# Patient Record
Sex: Female | Born: 1965 | Race: Black or African American | Hispanic: No | Marital: Single | Smoking: Former smoker
Health system: Southern US, Community
[De-identification: ages and names within clinical notes are randomized; demographics above are authoritative.]

## PROBLEM LIST (undated history)

## (undated) DIAGNOSIS — E119 Type 2 diabetes mellitus without complications: Secondary | ICD-10-CM

## (undated) DIAGNOSIS — L409 Psoriasis, unspecified: Secondary | ICD-10-CM

## (undated) DIAGNOSIS — R7303 Prediabetes: Secondary | ICD-10-CM

## (undated) DIAGNOSIS — E559 Vitamin D deficiency, unspecified: Secondary | ICD-10-CM

## (undated) DIAGNOSIS — I639 Cerebral infarction, unspecified: Secondary | ICD-10-CM

## (undated) DIAGNOSIS — L4050 Arthropathic psoriasis, unspecified: Secondary | ICD-10-CM

## (undated) DIAGNOSIS — K900 Celiac disease: Secondary | ICD-10-CM

## (undated) DIAGNOSIS — L732 Hidradenitis suppurativa: Secondary | ICD-10-CM

## (undated) DIAGNOSIS — E7800 Pure hypercholesterolemia, unspecified: Secondary | ICD-10-CM

## (undated) DIAGNOSIS — M069 Rheumatoid arthritis, unspecified: Secondary | ICD-10-CM

## (undated) HISTORY — PX: TONSILLECTOMY: SUR1361

## (undated) HISTORY — PX: SHOULDER SURGERY: SHX246

## (undated) HISTORY — PX: BACK SURGERY: SHX140

## (undated) HISTORY — PX: TUBAL LIGATION: SHX77

## (undated) HISTORY — PX: LIVER BIOPSY: SHX301

---

## 2014-05-15 ENCOUNTER — Emergency Department: Payer: Self-pay | Admitting: Emergency Medicine

## 2014-05-15 LAB — URINALYSIS, COMPLETE
BACTERIA: NONE SEEN
Bilirubin,UR: NEGATIVE
Glucose,UR: NEGATIVE mg/dL (ref 0–75)
Hyaline Cast: 2
KETONE: NEGATIVE
Leukocyte Esterase: NEGATIVE
Nitrite: NEGATIVE
Ph: 6 (ref 4.5–8.0)
Protein: NEGATIVE
RBC,UR: NONE SEEN /HPF (ref 0–5)
SPECIFIC GRAVITY: 1.004 (ref 1.003–1.030)
WBC UR: NONE SEEN /HPF (ref 0–5)

## 2014-05-15 LAB — TROPONIN I: Troponin-I: <0.02 ng/mL

## 2014-05-15 LAB — CBC
HCT: 39.1 % (ref 35.0–47.0)
HGB: 12.4 g/dL (ref 12.0–16.0)
MCH: 27 pg (ref 26.0–34.0)
MCHC: 31.6 g/dL — ABNORMAL LOW (ref 32.0–36.0)
MCV: 85 fL (ref 80–100)
Platelet: 229 10*3/uL (ref 150–440)
RBC: 4.58 10*6/uL (ref 3.80–5.20)
RDW: 13.8 % (ref 11.5–14.5)
WBC: 12.6 10*3/uL — ABNORMAL HIGH (ref 3.6–11.0)

## 2014-05-15 LAB — COMPREHENSIVE METABOLIC PANEL
ANION GAP: 5 — AB (ref 7–16)
AST: 24 U/L (ref 15–37)
Albumin: 3.2 g/dL — ABNORMAL LOW (ref 3.4–5.0)
Alkaline Phosphatase: 112 U/L
BILIRUBIN TOTAL: 0.3 mg/dL (ref 0.2–1.0)
BUN: 5 mg/dL — AB (ref 7–18)
CALCIUM: 8.5 mg/dL (ref 8.5–10.1)
Chloride: 109 mmol/L — ABNORMAL HIGH (ref 98–107)
Co2: 25 mmol/L (ref 21–32)
Creatinine: 0.9 mg/dL (ref 0.60–1.30)
EGFR (African American): >60
EGFR (Non-African Amer.): >60
GLUCOSE: 97 mg/dL (ref 65–99)
Osmolality: 275 (ref 275–301)
Potassium: 3.7 mmol/L (ref 3.5–5.1)
SGPT (ALT): 34 U/L
SODIUM: 139 mmol/L (ref 136–145)
TOTAL PROTEIN: 7.4 g/dL (ref 6.4–8.2)

## 2014-10-01 ENCOUNTER — Emergency Department: Payer: Self-pay | Admitting: Emergency Medicine

## 2015-01-20 ENCOUNTER — Emergency Department
Admit: 2015-01-20 | Disposition: A | Discharge status: Home / Self Care | Payer: Self-pay | Admitting: Emergency Medicine

## 2015-07-24 ENCOUNTER — Emergency Department
Admission: EM | Admit: 2015-07-24 | Discharge: 2015-07-24 | Disposition: A | Discharge status: Home / Self Care | Payer: Self-pay | Attending: Emergency Medicine | Admitting: Emergency Medicine

## 2015-07-24 ENCOUNTER — Emergency Department: Payer: Self-pay

## 2015-07-24 ENCOUNTER — Encounter: Payer: Self-pay | Admitting: *Deleted

## 2015-07-24 DIAGNOSIS — M25531 Pain in right wrist: Secondary | ICD-10-CM | POA: Insufficient documentation

## 2015-07-24 MED ORDER — KETOROLAC TROMETHAMINE 60 MG/2ML IM SOLN
60.0000 mg | Freq: Once | INTRAMUSCULAR | Status: AC
  Administered 2015-07-24: 60 mg via INTRAMUSCULAR
  Filled 2015-07-24: qty 2

## 2015-07-24 MED ORDER — DICLOFENAC SODIUM 75 MG PO TBEC: 75.0000 mg | DELAYED_RELEASE_TABLET | Freq: Two times a day (BID) | ORAL | Status: DC

## 2015-07-24 MED ORDER — HYDROCODONE-ACETAMINOPHEN 5-325 MG PO TABS: 1.0000 | ORAL_TABLET | ORAL | Status: DC | PRN

## 2015-07-24 NOTE — ED Provider Notes (Signed)
[facility] Emergency Department Chimamanda Siegfried Note  ____________________________________________  Time seen: Approximately 7:57 PM  I have reviewed the triage vital signs and the nursing notes.   HISTORY  Chief Complaint Wrist Pain    HPI Lexee Brashears is a 49 y.o. female who presents to the emergency room for right wrist pain for 4 days. Denies any known injury but states that she has a difficulty time using her arm. States pain is worse when she is trying to flip from pronate and supinate.   History reviewed. No pertinent past medical history.  There are no active problems to display for this patient.   Past Surgical History  Procedure Laterality Date  . Back surgery    . Tubal ligation    . Tonsillectomy    . Shoulder surgery      Current Outpatient Rx  Name  Route  Sig  Dispense  Refill  . diclofenac (VOLTAREN) 75 MG EC tablet   Oral   Take 1 tablet (75 mg total) by mouth 2 (two) times daily.   60 tablet   0   . HYDROcodone-acetaminophen (NORCO) 5-325 MG tablet   Oral   Take 1-2 tablets by mouth every 4 (four) hours as needed for moderate pain.   15 tablet   0     Allergies Review of patient's allergies indicates no known allergies.  No family history on file.  Social History Social History  Substance Use Topics  . Smoking status: Never Smoker   . Smokeless tobacco: None  . Alcohol Use: No    Review of Systems Constitutional: No fever/chills Eyes: No visual changes. ENT: No sore throat. Cardiovascular: Denies chest pain. Respiratory: Denies shortness of breath. Gastrointestinal: No abdominal pain.  No nausea, no vomiting.  No diarrhea.  No constipation. Genitourinary: Negative for dysuria. Musculoskeletal: Positive for right wrist pain Skin: Negative for rash. Neurological: Negative for headaches, focal weakness or numbness.  10-point ROS otherwise negative.  ____________________________________________   PHYSICAL  EXAM:  VITAL SIGNS: ED Triage Vitals  Enc Vitals Group     BP 07/24/15 1923 118/58 mmHg     Pulse Rate 07/24/15 1923 71     Resp 07/24/15 1923 18     Temp 07/24/15 1923 98 F (36.7 C)     Temp Source 07/24/15 1923 Oral     SpO2 07/24/15 1923 97 %     Weight 07/24/15 1923 216 lb (97.977 kg)     Height 07/24/15 1923 5\' 6"  (1.676 m)     Head Cir --      Peak Flow --      Pain Score 07/24/15 1920 9     Pain Loc --      Pain Edu? --      Excl. in [place]? --     Constitutional: Alert and oriented. Well appearing and in no acute distress. Cardiovascular: Normal rate, regular rhythm. Grossly normal heart sounds.  Good peripheral circulation. Respiratory: Normal respiratory effort.  No retractions. Lungs CTAB. Musculoskeletal: Right wrist with limited range of motion. Distal neurovascularly intact. Increased pain with pronation to supination. Able to flex but with extreme pain. No erythema or edema noted Neurologic:  Normal speech and language. No gross focal neurologic deficits are appreciated. No gait instability. Skin:  Skin is warm, dry and intact. No rash noted. Psychiatric: Mood and affect are normal. Speech and behavior are normal.  ____________________________________________   LABS (all labs ordered are listed, but only abnormal results are displayed)  Labs Reviewed - No data to display ____________________________________________    RADIOLOGY  Negative for fracture dislocation or edema. ____________________________________________   PROCEDURES  Procedure(s) performed: None  Critical Care performed: No  ____________________________________________   INITIAL IMPRESSION / ASSESSMENT AND PLAN / ED COURSE  Pertinent labs & imaging results that were available during my care of the patient were reviewed by me and considered in my medical decision making (see chart for details).  Acute right wrist pain, etiology unknown. Rx given for diclofenac 75 mg twice a day, and  Norco 5/325. Cockup wrist splint Velcro provided. Patient follow-up with PCP or return to the ER as needed. ____________________________________________   FINAL CLINICAL IMPRESSION(S) / ED DIAGNOSES  Final diagnoses:  Wrist pain, acute, right      Arlyss Repress, PA-C 07/24/15 2043  Schuyler Amor, MD 07/24/15 [phone]

## 2015-07-24 NOTE — ED Notes (Signed)
Pt reports right wrist pain since Sunday. No known injury.

## 2015-07-24 NOTE — ED Notes (Signed)
Patient transported to X-ray 

## 2015-07-24 NOTE — Discharge Instructions (Signed)
Cryotherapy °Cryotherapy means treatment with cold. Ice or gel packs can be used to reduce both pain and swelling. Ice is the most helpful within the first 24 to 48 hours after an injury or flare-up from overusing a muscle or joint. Sprains, strains, spasms, burning pain, shooting pain, and aches can all be eased with ice. Ice can also be used when recovering from surgery. Ice is effective, has very few side effects, and is safe for most people to use. °PRECAUTIONS  °Ice is not a safe treatment option for people with: °· Raynaud phenomenon. This is a condition affecting small blood vessels in the extremities. Exposure to cold may cause your problems to return. °· Cold hypersensitivity. There are many forms of cold hypersensitivity, including: °· Cold urticaria. Red, itchy hives appear on the skin when the tissues begin to warm after being iced. °· Cold erythema. This is a red, itchy rash caused by exposure to cold. °· Cold hemoglobinuria. Red blood cells break down when the tissues begin to warm after being iced. The hemoglobin that carry oxygen are passed into the urine because they cannot combine with blood proteins fast enough. °· Numbness or altered sensitivity in the area being iced. °If you have any of the following conditions, do not use ice until you have discussed cryotherapy with your caregiver: °· Heart conditions, such as arrhythmia, angina, or chronic heart disease. °· High blood pressure. °· Healing wounds or open skin in the area being iced. °· Current infections. °· Rheumatoid arthritis. °· Poor circulation. °· Diabetes. °Ice slows the blood flow in the region it is applied. This is beneficial when trying to stop inflamed tissues from spreading irritating chemicals to surrounding tissues. However, if you expose your skin to cold temperatures for too long or without the proper protection, you can damage your skin or nerves. Watch for signs of skin damage due to cold. °HOME CARE INSTRUCTIONS °Follow  these tips to use ice and cold packs safely. °· Place a dry or damp towel between the ice and skin. A damp towel will cool the skin more quickly, so you may need to shorten the time that the ice is used. °· For a more rapid response, add gentle compression to the ice. °· Ice for no more than 10 to 20 minutes at a time. The bonier the area you are icing, the less time it will take to get the benefits of ice. °· Check your skin after 5 minutes to make sure there are no signs of a poor response to cold or skin damage. °· Rest 20 minutes or more between uses. °· Once your skin is numb, you can end your treatment. You can test numbness by very lightly touching your skin. The touch should be so light that you do not see the skin dimple from the pressure of your fingertip. When using ice, most people will feel these normal sensations in this order: cold, burning, aching, and numbness. °· Do not use ice on someone who cannot communicate their responses to pain, such as small children or people with dementia. °HOW TO MAKE AN ICE PACK °Ice packs are the most common way to use ice therapy. Other methods include ice massage, ice baths, and cryosprays. Muscle creams that cause a cold, tingly feeling do not offer the same benefits that ice offers and should not be used as a substitute unless recommended by your caregiver. °To make an ice pack, do one of the following: °· Place crushed ice or a   bag of frozen vegetables in a sealable plastic bag. Squeeze out the excess air. Place this bag inside another plastic bag. Slide the bag into a pillowcase or place a damp towel between your skin and the bag.  Mix 3 parts water with 1 part rubbing alcohol. Freeze the mixture in a sealable plastic bag. When you remove the mixture from the freezer, it will be slushy. Squeeze out the excess air. Place this bag inside another plastic bag. Slide the bag into a pillowcase or place a damp towel between your skin and the bag. SEEK MEDICAL CARE  IF:  You develop white spots on your skin. This may give the skin a blotchy (mottled) appearance.  Your skin turns blue or pale.  Your skin becomes waxy or hard.  Your swelling gets worse. MAKE SURE YOU:   Understand these instructions.  Will watch your condition.  Will get help right away if you are not doing well or get worse.   This information is not intended to replace advice given to you by your health care provider. Make sure you discuss any questions you have with your health care provider.   Document Released: 05/18/2011 Document Revised: 10/12/2014 Document Reviewed: 05/18/2011 Elsevier Interactive Patient Education 2016 [place] therapy can help ease sore, stiff, injured, and tight muscles and joints. Heat relaxes your muscles, which may help ease your pain.  RISKS AND COMPLICATIONS If you have any of the following conditions, do not use heat therapy unless your health care provider has approved:  Poor circulation.  Healing wounds or scarred skin in the area being treated.  Diabetes, heart disease, or high blood pressure.  Not being able to feel (numbness) the area being treated.  Unusual swelling of the area being treated.  Active infections.  Blood clots.  Cancer.  Inability to communicate pain. This may include young children and people who have problems with their brain function (dementia).  Pregnancy. Heat therapy should only be used on old, pre-existing, or long-lasting (chronic) injuries. Do not use heat therapy on new injuries unless directed by your health care provider. HOW TO USE HEAT THERAPY There are several different kinds of heat therapy, including:  Moist heat pack.  Warm water bath.  Hot water bottle.  Electric heating pad.  Heated gel pack.  Heated wrap.  Electric heating pad. Use the heat therapy method suggested by your health care provider. Follow your health care provider's instructions on when  and how to use heat therapy. GENERAL HEAT THERAPY RECOMMENDATIONS  Do not sleep while using heat therapy. Only use heat therapy while you are awake.  Your skin may turn pink while using heat therapy. Do not use heat therapy if your skin turns red.  Do not use heat therapy if you have new pain.  High heat or long exposure to heat can cause burns. Be careful when using heat therapy to avoid burning your skin.  Do not use heat therapy on areas of your skin that are already irritated, such as with a rash or sunburn. SEEK MEDICAL CARE IF:  You have blisters, redness, swelling, or numbness.  You have new pain.  Your pain is worse. MAKE SURE YOU:  Understand these instructions.  Will watch your condition.  Will get help right away if you are not doing well or get worse.   This information is not intended to replace advice given to you by your health care provider. Make sure you discuss any questions you  have with your health care provider.   Document Released: 12/14/2011 Document Revised: 10/12/2014 Document Reviewed: 11/14/2013 Elsevier Interactive Patient Education 2016 [employer] Inc[employer]  Wrist Pain There are many things that can cause wrist pain. Some common causes include:  An injury to the wrist area, such as a sprain, strain, or fracture.  Overuse of the joint.  A condition that causes increased pressure on a nerve in the wrist (carpal tunnel syndrome).  Wear and tear of the joints that occurs with aging (osteoarthritis).  A variety of other types of arthritis. Sometimes, the cause of wrist pain is not known. The pain often goes away when you follow your health care provider's instructions for relieving pain at home. If your wrist pain continues, tests may need to be done to diagnose your condition. HOME CARE INSTRUCTIONS Pay attention to any changes in your symptoms. Take these actions to help with your pain:  Rest the wrist area for at least 48 hours or as told by your  health care provider.  If directed, apply ice to the injured area:  Put ice in a plastic bag.  Place a towel between your skin and the bag.  Leave the ice on for 20 minutes, 2-3 times per day.  Keep your arm raised (elevated) above the level of your heart while you are sitting or lying down.  If a splint or elastic bandage has been applied, use it as told by your health care provider.  Remove the splint or bandage only as told by your health care provider.  Loosen the splint or bandage if your fingers become numb or have a tingling feeling, or if they turn cold or blue.  Take over-the-counter and prescription medicines only as told by your health care provider.  Keep all follow-up visits as told by your health care provider. This is important. SEEK MEDICAL CARE IF:  Your pain is not helped by treatment.  Your pain gets worse. SEEK IMMEDIATE MEDICAL CARE IF:  Your fingers become swollen.  Your fingers turn white, very red, or cold and blue.  Your fingers are numb or have a tingling feeling.  You have difficulty moving your fingers.   This information is not intended to replace advice given to you by your health care provider. Make sure you discuss any questions you have with your health care provider.   Document Released: 07/01/2005 Document Revised: 06/12/2015 Document Reviewed: 02/06/2015 Elsevier Interactive Patient Education [employer].

## 2016-03-25 ENCOUNTER — Emergency Department
Admission: EM | Admit: 2016-03-25 | Discharge: 2016-03-25 | Disposition: A | Discharge status: Home / Self Care | Payer: Self-pay | Attending: Emergency Medicine | Admitting: Emergency Medicine

## 2016-03-25 ENCOUNTER — Encounter: Payer: Self-pay | Admitting: Emergency Medicine

## 2016-03-25 DIAGNOSIS — N61 Mastitis without abscess: Secondary | ICD-10-CM | POA: Insufficient documentation

## 2016-03-25 HISTORY — DX: Arthropathic psoriasis, unspecified: L40.50

## 2016-03-25 HISTORY — DX: Psoriasis, unspecified: L40.9

## 2016-03-25 HISTORY — DX: Vitamin D deficiency, unspecified: E55.9

## 2016-03-25 HISTORY — DX: Pure hypercholesterolemia, unspecified: E78.00

## 2016-03-25 HISTORY — DX: Celiac disease: K90.0

## 2016-03-25 HISTORY — DX: Prediabetes: R73.03

## 2016-03-25 LAB — CBC
HCT: 40.8 % (ref 35.0–47.0)
Hemoglobin: 13.2 g/dL (ref 12.0–16.0)
MCH: 26.7 pg (ref 26.0–34.0)
MCHC: 32.2 g/dL (ref 32.0–36.0)
MCV: 82.9 fL (ref 80.0–100.0)
PLATELETS: 227 10*3/uL (ref 150–440)
RBC: 4.92 MIL/uL (ref 3.80–5.20)
RDW: 13.4 % (ref 11.5–14.5)
WBC: 16.2 10*3/uL — ABNORMAL HIGH (ref 3.6–11.0)

## 2016-03-25 LAB — BASIC METABOLIC PANEL
Anion gap: 7 (ref 5–15)
BUN: 10 mg/dL (ref 6–20)
CALCIUM: 9.1 mg/dL (ref 8.9–10.3)
CO2: 25 mmol/L (ref 22–32)
CREATININE: 0.74 mg/dL (ref 0.44–1.00)
Chloride: 107 mmol/L (ref 101–111)
GFR calc non Af Amer: >60 mL/min (ref >60)
GLUCOSE: 87 mg/dL (ref 65–99)
Potassium: 3.7 mmol/L (ref 3.5–5.1)
Sodium: 139 mmol/L (ref 135–145)

## 2016-03-25 MED ORDER — OXYCODONE-ACETAMINOPHEN 5-325 MG PO TABS
1.0000 | ORAL_TABLET | ORAL | Status: DC | PRN
  Administered 2016-03-25: 1 via ORAL
  Filled 2016-03-25: qty 1

## 2016-03-25 MED ORDER — CEPHALEXIN 500 MG PO CAPS: 500.0000 mg | ORAL_CAPSULE | Freq: Three times a day (TID) | ORAL | Status: DC

## 2016-03-25 MED ORDER — SULFAMETHOXAZOLE-TRIMETHOPRIM 800-160 MG PO TABS: 1.0000 | ORAL_TABLET | Freq: Two times a day (BID) | ORAL | Status: DC

## 2016-03-25 MED ORDER — OXYCODONE-ACETAMINOPHEN 5-325 MG PO TABS: 1.0000 | ORAL_TABLET | Freq: Four times a day (QID) | ORAL | Status: DC | PRN

## 2016-03-25 NOTE — ED Notes (Signed)
Pt in via triage with complaints of left breast pain w/ red tinged discharge x 3 days.  Pt states pain is more severe today.  Pt reports last mammogram over 2 years ago, denies any family hx of breast cancer.  Pt A/Ox4, no immediate distress at this time.

## 2016-03-25 NOTE — ED Provider Notes (Signed)
[facility] Emergency Department Provider Note  ____________________________________________  Time seen: 3:40 PM  I have reviewed the triage vital signs and the nursing notes.   HISTORY  Chief Complaint Breast Pain and Breast Discharge    HPI Linda Bennett is a 49 y.o. female who complains of red tinged discharge from the left nipple for the past 3 days. No fevers or chills or vomiting. She otherwise feels well except for increasing pain in the left breast speech has a history of an abscess in the medial left breast that required surgical resection. No chest pain or shortness of breath. No recent trauma. He feels that the left breast has become swollen.     Past Medical History  Diagnosis Date  . Psoriasis   . Vitamin D deficiency   . Celiac disease   . Psoriatic arthritis ([place])   . High cholesterol   . Pre-diabetes      There are no active problems to display for this patient.    Past Surgical History  Procedure Laterality Date  . Back surgery    . Tubal ligation    . Tonsillectomy    . Shoulder surgery       Current Outpatient Rx  Name  Route  Sig  Dispense  Refill  . cephALEXin (KEFLEX) 500 MG capsule   Oral   Take 1 capsule (500 mg total) by mouth 3 (three) times daily.   21 capsule   0   . diclofenac (VOLTAREN) 75 MG EC tablet   Oral   Take 1 tablet (75 mg total) by mouth 2 (two) times daily.   60 tablet   0   . HYDROcodone-acetaminophen (NORCO) 5-325 MG tablet   Oral   Take 1-2 tablets by mouth every 4 (four) hours as needed for moderate pain.   15 tablet   0   . oxyCODONE-acetaminophen (ROXICET) 5-325 MG tablet   Oral   Take 1 tablet by mouth every 6 (six) hours as needed for severe pain.   12 tablet   0   . sulfamethoxazole-trimethoprim (BACTRIM DS) 800-160 MG tablet   Oral   Take 1 tablet by mouth 2 (two) times daily.   20 tablet   0      Allergies Review of patient's allergies indicates no known  allergies.   No family history on file.  Social History Social History  Substance Use Topics  . Smoking status: Never Smoker   . Smokeless tobacco: None  . Alcohol Use: No    Review of Systems  Constitutional:   No fever or chills.  Eyes:   No vision changes.  ENT:   No sore throat. No rhinorrhea. Cardiovascular:   No chest pain. Respiratory:   No dyspnea or cough. Gastrointestinal:   Negative for abdominal pain, vomiting and diarrhea.  Genitourinary:   Negative for dysuria or difficulty urinating. Musculoskeletal:   Left breast swelling and pain Neurological:   Negative for headaches 10-point ROS otherwise negative.  ____________________________________________   PHYSICAL EXAM:  VITAL SIGNS: ED Triage Vitals  Enc Vitals Group     BP 03/25/16 1422 135/81 mmHg     Pulse Rate 03/25/16 1422 62     Resp 03/25/16 1422 18     Temp 03/25/16 1422 98.5 F (36.9 C)     Temp Source 03/25/16 1422 Oral     SpO2 03/25/16 1422 100 %     Weight 03/25/16 1422 207 lb (93.895 kg)     Height 03/25/16  1422 5\' 6"  (1.676 m)     Head Cir --      Peak Flow --      Pain Score 03/25/16 1422 9     Pain Loc --      Pain Edu? --      Excl. in [place]? --     Vital signs reviewed, nursing assessments reviewed. Patient examined with nurse Delilah Shan at the bedside  Constitutional:   Alert and oriented. Well appearing and in no distress. Eyes:   No scleral icterus. No conjunctival pallor. EOMI ENT   Head:   Normocephalic and atraumatic.  Hematological/Lymphatic/Immunilogical:   No cervical lymphadenopathy. Cardiovascular:   RRR. Symmetric bilateral radial and DP pulses.  No murmurs.  Respiratory:   Normal respiratory effort without tachypnea nor retractions. Breath sounds are clear and equal bilaterally. No wheezes/rales/rhonchi.  Musculoskeletal:   Chest wall nontender. Left breast reveals an area in the inferomedial aspect of firmness and enlargement lingular tissue. Palpation of this  area expresses a brownish clear liquid from the nipple. This elicits pain and tenderness from the patient as well. No fluctuance. The rest of the breast is unremarkable.   Skin:   Inferomedial left breast scar from prior abscess surgery, medial to the affected area currently. Skin otherwise warm dry and intact unremarkable.  ____________________________________________    LABS (pertinent positives/negatives) (all labs ordered are listed, but only abnormal results are displayed) Labs Reviewed  CBC - Abnormal; Notable for the following:    WBC 16.2 (*)    All other components within normal limits  AEROBIC CULTURE (SUPERFICIAL SPECIMEN)  BASIC METABOLIC PANEL   ____________________________________________   EKG    ____________________________________________    RADIOLOGY    ____________________________________________   PROCEDURES   ____________________________________________   INITIAL IMPRESSION / ASSESSMENT AND PLAN / ED COURSE  Pertinent labs & imaging results that were available during my care of the patient were reviewed by me and considered in my medical decision making (see chart for details).  Patient presents with non-lactational mastitis of the left breast. 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 Patient is well-appearing and has a mild leukocytosis but otherwise unremarkable with normal vital signs. We'll start her on Bactrim and Keflex to hopefully clear the infection. Counseled her to take NSAIDs, short course of Percocet as needed, warm compresses, follow up with primary care and if unresolved surgery in a week. No evidence of any underlying cardiopulmonary pathology.     ____________________________________________   FINAL CLINICAL IMPRESSION(S) / ED DIAGNOSES  Final diagnoses:  Acute mastitis of left breast       Portions of this note were generated with dragon dictation software. Dictation errors may  occur despite best attempts at proofreading.   Carrie Mew, MD 03/25/16 [phone]

## 2016-03-25 NOTE — ED Notes (Signed)
Patient presents to the ED with left breast pain x 1 week, with reddish discharge x 3 days.  Patient states discharge was brown at first but now it's reddish.  Patient states she has had abnormal mammograms in the past that showed something unusual that was benign but it caused patient to have occasional clear discharge for the past 4 years.  Patient states left breast has also increased in size.

## 2016-03-25 NOTE — Discharge Instructions (Signed)
You were prescribed a medication that is potentially sedating. Do not drink alcohol, drive or participate in any other potentially dangerous activities while taking this medication as it may make you sleepy. Do not take this medication with any other sedating medications, either prescription or over-the-counter. If you were prescribed Percocet or Vicodin, do not take these with acetaminophen (Tylenol) as it is already contained within these medications.   Opioid pain medications (or "narcotics") can be habit forming.  Use it as little as possible to achieve adequate pain control.  Do not use or use it with extreme caution if you have a history of opiate abuse or dependence.  If you are on a pain contract with your primary care doctor or a pain specialist, be sure to let them know you were prescribed this medication today from the [facility] Emergency Department.  This medication is intended for your use only - do not give any to anyone else and keep it in a secure place where nobody else, especially children and pets, have access to it.  It will also cause or worsen constipation, so you may want to consider taking an over-the-counter stool softener while you are taking this medication.

## 2016-03-31 LAB — AEROBIC CULTURE W GRAM STAIN (SUPERFICIAL SPECIMEN): Culture: NORMAL

## 2016-03-31 LAB — AEROBIC CULTURE  (SUPERFICIAL SPECIMEN)

## 2016-08-21 ENCOUNTER — Emergency Department: Payer: Self-pay

## 2016-08-21 ENCOUNTER — Encounter: Payer: Self-pay | Admitting: Emergency Medicine

## 2016-08-21 ENCOUNTER — Emergency Department
Admission: EM | Admit: 2016-08-21 | Discharge: 2016-08-22 | Disposition: A | Discharge status: Home / Self Care | Payer: Self-pay | Attending: Student in an Organized Health Care Education/Training Program | Admitting: Student in an Organized Health Care Education/Training Program

## 2016-08-21 DIAGNOSIS — R51 Headache: Secondary | ICD-10-CM | POA: Insufficient documentation

## 2016-08-21 DIAGNOSIS — R519 Headache, unspecified: Secondary | ICD-10-CM

## 2016-08-21 DIAGNOSIS — Z79899 Other long term (current) drug therapy: Secondary | ICD-10-CM | POA: Insufficient documentation

## 2016-08-21 MED ORDER — ACETAMINOPHEN 500 MG PO TABS
1000.0000 mg | ORAL_TABLET | Freq: Once | ORAL | Status: AC
  Administered 2016-08-21: 1000 mg via ORAL
  Filled 2016-08-21: qty 2

## 2016-08-21 MED ORDER — DEXAMETHASONE SODIUM PHOSPHATE 4 MG/ML IJ SOLN
INTRAMUSCULAR | Status: AC
  Filled 2016-08-21: qty 3

## 2016-08-21 MED ORDER — TETRACAINE HCL 0.5 % OP SOLN
2.0000 [drp] | Freq: Once | OPHTHALMIC | Status: AC
  Administered 2016-08-21: 2 [drp] via OPHTHALMIC
  Filled 2016-08-21: qty 2

## 2016-08-21 MED ORDER — PROCHLORPERAZINE EDISYLATE 5 MG/ML IJ SOLN
10.0000 mg | Freq: Once | INTRAMUSCULAR | Status: AC
  Administered 2016-08-21: 10 mg via INTRAVENOUS
  Filled 2016-08-21: qty 2

## 2016-08-21 MED ORDER — DIPHENHYDRAMINE HCL 50 MG/ML IJ SOLN
25.0000 mg | Freq: Once | INTRAMUSCULAR | Status: AC
  Administered 2016-08-21: 25 mg via INTRAVENOUS
  Filled 2016-08-21: qty 1

## 2016-08-21 MED ORDER — DEXAMETHASONE SODIUM PHOSPHATE 10 MG/ML IJ SOLN
10.0000 mg | Freq: Once | INTRAMUSCULAR | Status: AC
  Administered 2016-08-21: 10 mg via INTRAVENOUS
  Filled 2016-08-21: qty 1

## 2016-08-21 NOTE — ED Triage Notes (Signed)
Pt. States HA started around 2 pm today.  Pt. States vomiting 3 times today, denies anyone in household with same symptoms.  Pt. Had two cysts removed this past Tuesday.

## 2016-08-21 NOTE — ED Provider Notes (Signed)
[facility] Emergency Department Provider Note    None    (approximate)  I have reviewed the triage vital signs and the nursing notes.   HISTORY  Chief Complaint Headache (Pt. states sudden HA today that started around 2 pm.  Pressure behind rt. eye and rt. side of head.) and Emesis (Pt. states "I vomited 3 times today after HA started.)    HPI Linda Bennett is a 50 y.o. female with a history of migraine headaches presents with acute headache started midafternoon on the right side of her head. Patient states that after onset of headache she had gradual worsening and it seemed to be located on the right side of her head behind her right eye radiating to the back of her head. Associated symptoms included nausea and vomiting. No recent fevers. No visual disturbance. No recent trauma. She's not on any blood thinners. No history of aneurysms or renal cysts. She currently rates the headache is not out of 10 in severity. There was not any thunderclap or sudden onset headache.   Past Medical History:  Diagnosis Date  . Celiac disease   . High cholesterol   . Pre-diabetes   . Psoriasis   . Psoriatic arthritis ([place])   . Vitamin D deficiency    No family history on file. Past Surgical History:  Procedure Laterality Date  . BACK SURGERY    . SHOULDER SURGERY    . TONSILLECTOMY    . TUBAL LIGATION     There are no active problems to display for this patient.     Prior to Admission medications   Medication Sig Start Date End Date Taking? Authorizing Provider  cephALEXin (KEFLEX) 500 MG capsule Take 1 capsule (500 mg total) by mouth 3 (three) times daily. 03/25/16   Carrie Mew, MD  diclofenac (VOLTAREN) 75 MG EC tablet Take 1 tablet (75 mg total) by mouth 2 (two) times daily. 07/24/15   Arlyss Repress, PA-C  HYDROcodone-acetaminophen (NORCO) 5-325 MG tablet Take 1-2 tablets by mouth every 4 (four) hours as needed for moderate pain. 07/24/15   Arlyss Repress, PA-C  oxyCODONE-acetaminophen (ROXICET) 5-325 MG tablet Take 1 tablet by mouth every 6 (six) hours as needed for severe pain. 03/25/16   Carrie Mew, MD  sulfamethoxazole-trimethoprim (BACTRIM DS) 800-160 MG tablet Take 1 tablet by mouth 2 (two) times daily. 03/25/16   Carrie Mew, MD    Allergies Patient has no known allergies.    Social History Social History  Substance Use Topics  . Smoking status: Never Smoker  . Smokeless tobacco: Not on file  . Alcohol use No    Review of Systems Patient denies headaches, rhinorrhea, blurry vision, numbness, shortness of breath, chest pain, edema, cough, abdominal pain, nausea, vomiting, diarrhea, dysuria, fevers, rashes or hallucinations unless otherwise stated above in HPI. ____________________________________________   PHYSICAL EXAM:  VITAL SIGNS: Vitals:   08/21/16 2205  BP: 124/69  Pulse: 64  Resp: 18  Temp: 98 F (36.7 C)    Constitutional: Alert and oriented. in no acute distress. Eyes: Conjunctivae are normal. PERRL. EOMI. Head: Atraumatic. Nose: No congestion/rhinnorhea. Mouth/Throat: Mucous membranes are moist.  Oropharynx non-erythematous. Neck: No stridor. Painless ROM. No cervical spine tenderness to palpation Hematological/Lymphatic/Immunilogical: No cervical lymphadenopathy. Cardiovascular: Normal rate, regular rhythm. Grossly normal heart sounds.  Good peripheral circulation. Respiratory: Normal respiratory effort.  No retractions. Lungs CTAB. Gastrointestinal: Soft and nontender. No distention. No abdominal bruits. No CVA tenderness. Musculoskeletal: No lower extremity tenderness  nor edema.  No joint effusions. Neurologic:  CN- intact.  No facial droop, Normal FNF.  Normal heel to shin.  Sensation intact bilaterally. Normal speech and language. No gross focal neurologic deficits are appreciated. No gait instability.  Skin:  Skin is warm, dry and intact. No rash noted. Psychiatric: Mood and affect  are normal. Speech and behavior are normal.  ____________________________________________   LABS (all labs ordered are listed, but only abnormal results are displayed)  No results found for this or any previous visit (from the past 24 hour(s)). ____________________________________________  ____________________________________________  [id number]  I personally reviewed all radiographic images ordered to evaluate for the above acute complaints and reviewed radiology reports and findings.  These findings were personally discussed with the patient.  Please see medical record for radiology report. ____________________________________________   PROCEDURES  Procedure(s) performed: none Procedures    Critical Care performed: no ____________________________________________   INITIAL IMPRESSION / ASSESSMENT AND PLAN / ED COURSE  Pertinent labs & imaging results that were available during my care of the patient were reviewed by me and considered in my medical decision making (see chart for details).  DDX: migraine, tension, cluster, glaucoma, sah, sdh, gca  Satcha Legleiter is a 50 y.o. who presents to the ED with with Hx of migraines p/w HA for last 6 hours. Not worst HA ever. Gradual onset. HA similar to previous episodes. Denies focal neurologic symptoms. Denies trauma. No fevers or neck pain. No vision loss. Afebrile in ED. VSS. Exam as above. No meningeal signs. No CN, motor, sensory or cerebellar deficits. Temporal arteries palpable and non-tender. Appears well and non-toxic.  Will provide  IV medications for symptom control. Likely tension, non-specific or possible migraine HA. Clinical picture is not consistent with ICH, SAH, SDH, EDH, TIA, or CVA. No concern for meningitis or encephalitis. No concern for GCA/Temporal arteritis.   Clinical Course as of Aug 21 2340  Ludwig Clarks Aug 21, 2016  2335 Ocular pressure is 17 and right globe. Visual acuity is normal. Patient with significant  improvement in symptoms. Do not feel that lumbar puncture quickly indicated at this time as she is otherwise well-appearing and in no acute distress without any evidence of meningeal irritation.  Have discussed with the patient and available family all diagnostics and treatments performed thus far and all questions were answered to the best of my ability. The patient demonstrates understanding and agreement with plan.   [PR]    Clinical Course User Index [PR] Merlyn Lot, MD     ____________________________________________   FINAL CLINICAL IMPRESSION(S) / ED DIAGNOSES  Final diagnoses:  Acute nonintractable headache, unspecified headache type      NEW MEDICATIONS STARTED DURING THIS VISIT:  New Prescriptions   No medications on file     Note:  This document was prepared using Dragon voice recognition software and may include unintentional dictation errors.    Merlyn Lot, MD 08/21/16 [phone]

## 2016-08-21 NOTE — ED Notes (Signed)
Patient transported to CT 

## 2016-08-21 NOTE — Discharge Instructions (Signed)

## 2017-10-05 HISTORY — PX: BREAST BIOPSY: SHX20

## 2018-08-04 ENCOUNTER — Encounter: Payer: Self-pay | Admitting: *Deleted

## 2018-08-04 ENCOUNTER — Emergency Department
Admission: EM | Admit: 2018-08-04 | Discharge: 2018-08-04 | Disposition: A | Discharge status: Home / Self Care | Payer: Medicaid Other | Attending: Emergency Medicine | Admitting: Emergency Medicine

## 2018-08-04 ENCOUNTER — Other Ambulatory Visit: Payer: Self-pay

## 2018-08-04 DIAGNOSIS — F17210 Nicotine dependence, cigarettes, uncomplicated: Secondary | ICD-10-CM | POA: Diagnosis not present

## 2018-08-04 DIAGNOSIS — L4050 Arthropathic psoriasis, unspecified: Secondary | ICD-10-CM | POA: Diagnosis not present

## 2018-08-04 DIAGNOSIS — M25532 Pain in left wrist: Secondary | ICD-10-CM | POA: Diagnosis present

## 2018-08-04 HISTORY — DX: Hidradenitis suppurativa: L73.2

## 2018-08-04 MED ORDER — APREMILAST 10 & 20 & 30 MG PO TBPK: 30.0000 mg | ORAL_TABLET | Freq: Two times a day (BID) | ORAL | 0 refills | Status: DC

## 2018-08-04 MED ORDER — MELOXICAM 15 MG PO TABS: 15.0000 mg | ORAL_TABLET | Freq: Every day | ORAL | 0 refills | Status: DC

## 2018-08-04 NOTE — ED Triage Notes (Signed)
Pt reporting left wrist and hand pain that started yesterday. No new injury but swelling noted and pt presents wearing a wrist brace. Sensation and color intact with equal radial pulses bilaterally.

## 2018-08-04 NOTE — ED Notes (Signed)
See triage note  Presents with pain to lateral aspect of left wrist  States she did notice some swelling to area no swelling noted at present  Good pulses

## 2018-08-04 NOTE — ED Provider Notes (Signed)
[facility] Emergency Department Provider Note  ____________________________________________  Time seen: Approximately 3:17 PM  I have reviewed the triage vital signs and the nursing notes.   HISTORY  Chief Complaint Wrist Pain    HPI Linda Bennett is a 52 y.o. female who presents the emergency department complaining of 1 day history of left wrist pain.  Patient she has a history of psoriasis and psoriatic arthritis.  She has been undergoing treatment for several years on same.  On review of patient's chart, patient has been tried on multiple medications to include methotrexate until she had transiently elevated LFTs, Enbrel, and currently  on Cosentyx.  Patient reports that she has not had an increase in her psoriasis but given the weather changes has experienced multiple joint pain.  Patient's current complaint is left wrist pain.  She denies any erythema, gross edema.  She is able to perform full range of motion but doing so increases her pain.  No other injury or complaint.  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  She has been receiving her Cosentyx injections on schedule.  Patient denies any headache, fevers or chills, chest pain, shortness of breath, nausea or vomiting.   Past Medical History:  Diagnosis Date  . Celiac disease   . Hidradenitis suppurativa   . High cholesterol   . Pre-diabetes   . Psoriasis   . Psoriatic arthritis ([place])   . Vitamin D deficiency     There are no active problems to display for this patient.   Past Surgical History:  Procedure Laterality Date  . BACK SURGERY    . SHOULDER SURGERY    . TONSILLECTOMY    . TUBAL LIGATION      Prior to Admission medications   Medication Sig Start Date End Date Taking? Authorizing Provider  Apremilast 10 & 20 & 30 MG TBPK Take 30 mg by mouth 2 (two) times daily. Day 1: 10 mg in the AM.  Day 2: 10 mg twice daily  Day 3: 10 mg in the AM, 20 mg in the PM     Day 4: 20 mg twice daily   Day 5: 20 mg in the AM, 30 mg in the PM  30 mg twice daily daily thereafter. 08/04/18   Ernan Runkles, Charline Bills, PA-C  cephALEXin (KEFLEX) 500 MG capsule Take 1 capsule (500 mg total) by mouth 3 (three) times daily. 03/25/16   Carrie Mew, MD  diclofenac (VOLTAREN) 75 MG EC tablet Take 1 tablet (75 mg total) by mouth 2 (two) times daily. 07/24/15   Beers, Pierce Crane, PA-C  HYDROcodone-acetaminophen (NORCO) 5-325 MG tablet Take 1-2 tablets by mouth every 4 (four) hours as needed for moderate pain. 07/24/15   Beers, Pierce Crane, PA-C  meloxicam (MOBIC) 15 MG tablet Take 1 tablet (15 mg total) by mouth daily. 08/04/18   Camdyn Beske, Charline Bills, PA-C  oxyCODONE-acetaminophen (ROXICET) 5-325 MG tablet Take 1 tablet by mouth every 6 (six) hours as needed for severe pain. 03/25/16   Carrie Mew, MD  sulfamethoxazole-trimethoprim (BACTRIM DS) 800-160 MG tablet Take 1 tablet by mouth 2 (two) times daily. 03/25/16   Carrie Mew, MD    Allergies Patient has no known allergies.  No family history on file.  Social History Social History   Tobacco Use  . Smoking status: Current Every Day Smoker    Packs/day: 0.50    Types: Cigarettes  . Smokeless tobacco: Never Used  Substance Use Topics  .  Alcohol use: No  . Drug use: No     Review of Systems  Constitutional: No fever/chills Eyes: No visual changes.  Cardiovascular: no chest pain. Respiratory: no cough. No SOB. Gastrointestinal: No abdominal pain.  No nausea, no vomiting.  No diarrhea.  No constipation. Genitourinary: Negative for dysuria. No hematuria Musculoskeletal: Left wrist pain Skin: Positive for psoriatic rash, no increased from baseline.  No abrasions, lacerations, ecchymosis. Neurological: Negative for headaches, focal weakness or numbness. 10-point ROS otherwise negative.  ____________________________________________   PHYSICAL EXAM:  VITAL SIGNS: ED Triage Vitals [08/04/18 1510]   Enc Vitals Group     BP (!) 123/98     Pulse Rate 68     Resp 16     Temp 98.2 F (36.8 C)     Temp Source Oral     SpO2 98 %     Weight      Height      Head Circumference      Peak Flow      Pain Score 9     Pain Loc      Pain Edu?      Excl. in [place]?      Constitutional: Alert and oriented. Well appearing and in no acute distress. Eyes: Conjunctivae are normal. PERRL. EOMI. Head: Atraumatic. Neck: No stridor.    Cardiovascular: Normal rate, regular rhythm. Normal S1 and S2.  Good peripheral circulation. Respiratory: Normal respiratory effort without tachypnea or retractions. Lungs CTAB. Good air entry to the bases with no decreased or absent breath sounds. Musculoskeletal: Full range of motion to all extremities. No gross deformities appreciated.  Visualization of the left wrist reveals no deformity, edema, erythema.  Patient is able to perform full range of motion with encouragement.  Patient is tender to palpation over the ulnar styloid process.  No palpable abnormality or deficit.  Joint is minimally warm to palpation.  No ballottement.  Radial pulse intact.  Sensation intact all 5 digits distally. Neurologic:  Normal speech and language. No gross focal neurologic deficits are appreciated.  Skin:  Skin is warm, dry and intact. No rash noted. Psychiatric: Mood and affect are normal. Speech and behavior are normal. Patient exhibits appropriate insight and judgement.   ____________________________________________   LABS (all labs ordered are listed, but only abnormal results are displayed)  Labs Reviewed - No data to display ____________________________________________  EKG   ____________________________________________  RADIOLOGY   No results found.  ____________________________________________    PROCEDURES  Procedure(s) performed:    Procedures    Medications - No data to display   ____________________________________________   INITIAL  IMPRESSION / ASSESSMENT AND PLAN / ED COURSE  Pertinent labs & imaging results that were available during my care of the patient were reviewed by me and considered in my medical decision making (see chart for details).  Review of the [place] CSRS was performed in accordance of the [place] prior to dispensing any controlled drugs.      Patient's diagnosis is consistent with psoriatic arthritis.  Patient presents the emergency department with nontraumatic left wrist pain.  Patient has a history of psoriatic arthritis, states that symptoms are similar.  Patient reports that the weather change typically aggravates her psoriatic arthritis.  No trauma.  Differential includes psoriatic arthritis, rheumatoid arthritis, osteoarthritis, wrist sprain, fracture, gout, septic arthritis.  On exam, no gross indication of septic arthritis.  No history of gout.  Patient's symptoms are consistent with flare of her psoriatic arthritis.  Patient will be placed  on meloxicam for symptom improvement.  I discussed [place] with the patient.  I recommend discussing this medication with rheumatology before starting prescription for same.  Patient is prescribed meloxicam and [place] Nail, however she is to hold [place] prescription until she discusses with rheumatology..  Follow-up with primary care or rheumatology as needed.  Patient is given ED precautions to return to the ED for any worsening or new symptoms.     ____________________________________________  FINAL CLINICAL IMPRESSION(S) / ED DIAGNOSES  Final diagnoses:  Psoriatic arthritis ([place])  Left wrist pain      NEW MEDICATIONS STARTED DURING THIS VISIT:  ED Discharge Orders         Ordered    meloxicam (MOBIC) 15 MG tablet  Daily     08/04/18 1557    Apremilast 10 & 20 & 30 MG TBPK  2 times daily     08/04/18 1604              This chart was dictated using voice recognition software/Dragon. Despite best efforts to proofread, errors can occur which can change  the meaning. Any change was purely unintentional.    Darletta Moll, PA-C 08/04/18 1611    Nance Pear, MD 08/04/18 [phone]

## 2018-10-10 ENCOUNTER — Emergency Department
Admission: EM | Admit: 2018-10-10 | Discharge: 2018-10-10 | Discharge status: Against Medical Advice | Payer: Medicaid Other

## 2018-10-10 NOTE — ED Triage Notes (Signed)
Brought in by EMS for abd pain since yesterday EMs vitals b/p149/69, cbg 173

## 2019-02-25 ENCOUNTER — Encounter
Admission: EM | Disposition: A | Discharge status: Home / Self Care | Payer: Self-pay | Source: Home / Self Care | Attending: Emergency Medicine

## 2019-02-25 ENCOUNTER — Encounter: Payer: Self-pay | Admitting: *Deleted

## 2019-02-25 ENCOUNTER — Other Ambulatory Visit: Payer: Self-pay

## 2019-02-25 ENCOUNTER — Emergency Department: Payer: Medicaid Other

## 2019-02-25 ENCOUNTER — Observation Stay: Payer: Medicaid Other | Admitting: Registered Nurse

## 2019-02-25 ENCOUNTER — Observation Stay
Admission: EM | Admit: 2019-02-25 | Discharge: 2019-02-26 | Disposition: A | Discharge status: Home / Self Care | Payer: Medicaid Other | Attending: Surgery | Admitting: Surgery

## 2019-02-25 DIAGNOSIS — Z1159 Encounter for screening for other viral diseases: Secondary | ICD-10-CM | POA: Insufficient documentation

## 2019-02-25 DIAGNOSIS — K810 Acute cholecystitis: Secondary | ICD-10-CM | POA: Diagnosis not present

## 2019-02-25 DIAGNOSIS — Z791 Long term (current) use of non-steroidal anti-inflammatories (NSAID): Secondary | ICD-10-CM | POA: Diagnosis not present

## 2019-02-25 DIAGNOSIS — K760 Fatty (change of) liver, not elsewhere classified: Secondary | ICD-10-CM | POA: Insufficient documentation

## 2019-02-25 DIAGNOSIS — L4050 Arthropathic psoriasis, unspecified: Secondary | ICD-10-CM | POA: Insufficient documentation

## 2019-02-25 DIAGNOSIS — K8010 Calculus of gallbladder with chronic cholecystitis without obstruction: Principal | ICD-10-CM | POA: Insufficient documentation

## 2019-02-25 DIAGNOSIS — R1013 Epigastric pain: Secondary | ICD-10-CM | POA: Diagnosis present

## 2019-02-25 DIAGNOSIS — Z79899 Other long term (current) drug therapy: Secondary | ICD-10-CM | POA: Insufficient documentation

## 2019-02-25 DIAGNOSIS — K8020 Calculus of gallbladder without cholecystitis without obstruction: Secondary | ICD-10-CM

## 2019-02-25 DIAGNOSIS — K819 Cholecystitis, unspecified: Secondary | ICD-10-CM | POA: Diagnosis present

## 2019-02-25 DIAGNOSIS — F17210 Nicotine dependence, cigarettes, uncomplicated: Secondary | ICD-10-CM | POA: Diagnosis not present

## 2019-02-25 HISTORY — PX: CHOLECYSTECTOMY: SHX55

## 2019-02-25 LAB — COMPREHENSIVE METABOLIC PANEL
ALT: 187 U/L — ABNORMAL HIGH (ref 0–44)
AST: 396 U/L — ABNORMAL HIGH (ref 15–41)
Albumin: 3.9 g/dL (ref 3.5–5.0)
Alkaline Phosphatase: 188 U/L — ABNORMAL HIGH (ref 38–126)
Anion gap: 9 (ref 5–15)
BUN: 17 mg/dL (ref 6–20)
CO2: 25 mmol/L (ref 22–32)
Calcium: 9.2 mg/dL (ref 8.9–10.3)
Chloride: 105 mmol/L (ref 98–111)
Creatinine, Ser: 0.91 mg/dL (ref 0.44–1.00)
GFR calc Af Amer: >60 mL/min (ref >60)
GFR calc non Af Amer: >60 mL/min (ref >60)
Glucose, Bld: 165 mg/dL — ABNORMAL HIGH (ref 70–99)
Potassium: 4 mmol/L (ref 3.5–5.1)
Sodium: 139 mmol/L (ref 135–145)
Total Bilirubin: 1.4 mg/dL — ABNORMAL HIGH (ref 0.3–1.2)
Total Protein: 7.8 g/dL (ref 6.5–8.1)

## 2019-02-25 LAB — CBC
HCT: 38.5 % (ref 36.0–46.0)
Hemoglobin: 12.2 g/dL (ref 12.0–15.0)
MCH: 26.2 pg (ref 26.0–34.0)
MCHC: 31.7 g/dL (ref 30.0–36.0)
MCV: 82.6 fL (ref 80.0–100.0)
Platelets: 308 10*3/uL (ref 150–400)
RBC: 4.66 MIL/uL (ref 3.87–5.11)
RDW: 13.2 % (ref 11.5–15.5)
WBC: 14 10*3/uL — ABNORMAL HIGH (ref 4.0–10.5)
nRBC: 0 % (ref 0.0–0.2)

## 2019-02-25 LAB — SARS CORONAVIRUS 2 BY RT PCR (HOSPITAL ORDER, PERFORMED IN ~~LOC~~ HOSPITAL LAB): SARS Coronavirus 2: NEGATIVE

## 2019-02-25 LAB — SURGICAL PCR SCREEN
MRSA, PCR: NEGATIVE
Staphylococcus aureus: NEGATIVE

## 2019-02-25 LAB — LIPASE, BLOOD: Lipase: 49 U/L (ref 11–51)

## 2019-02-25 LAB — TROPONIN I: Troponin I: <0.03 ng/mL (ref <0.03)

## 2019-02-25 SURGERY — LAPAROSCOPIC CHOLECYSTECTOMY
Anesthesia: General

## 2019-02-25 MED ORDER — CHLORHEXIDINE GLUCONATE CLOTH 2 % EX PADS
6.0000 | MEDICATED_PAD | Freq: Once | CUTANEOUS | Status: AC
  Administered 2019-02-25: 13:00:00 6 via TOPICAL
  Filled 2019-02-25: qty 6

## 2019-02-25 MED ORDER — MIDAZOLAM HCL 2 MG/2ML IJ SOLN
INTRAMUSCULAR | Status: DC | PRN
  Administered 2019-02-25: 2 mg via INTRAVENOUS

## 2019-02-25 MED ORDER — ONDANSETRON HCL 4 MG/2ML IJ SOLN
INTRAMUSCULAR | Status: AC
  Filled 2019-02-25: qty 2

## 2019-02-25 MED ORDER — SODIUM CHLORIDE 0.9 % IV SOLN
INTRAVENOUS | Status: DC
  Administered 2019-02-25 – 2019-02-26 (×3): via INTRAVENOUS

## 2019-02-25 MED ORDER — ONDANSETRON 4 MG PO TBDP: 4.0000 mg | ORAL_TABLET | Freq: Four times a day (QID) | ORAL | Status: DC | PRN

## 2019-02-25 MED ORDER — GADOBUTROL 1 MMOL/ML IV SOLN
10.0000 mL | Freq: Once | INTRAVENOUS | Status: AC | PRN
  Administered 2019-02-25: 10 mL via INTRAVENOUS

## 2019-02-25 MED ORDER — LIDOCAINE HCL (PF) 2 % IJ SOLN
INTRAMUSCULAR | Status: AC
  Filled 2019-02-25: qty 10

## 2019-02-25 MED ORDER — PROCHLORPERAZINE EDISYLATE 10 MG/2ML IJ SOLN
5.0000 mg | Freq: Four times a day (QID) | INTRAMUSCULAR | Status: DC | PRN
  Administered 2019-02-25: 10 mg via INTRAVENOUS
  Filled 2019-02-25 (×2): qty 2

## 2019-02-25 MED ORDER — FENTANYL CITRATE (PF) 100 MCG/2ML IJ SOLN
25.0000 ug | INTRAMUSCULAR | Status: DC | PRN
  Administered 2019-02-25 (×4): 25 ug via INTRAVENOUS

## 2019-02-25 MED ORDER — CODEINE SULFATE 30 MG PO TABS: 60.0000 mg | ORAL_TABLET | ORAL | Status: DC | PRN

## 2019-02-25 MED ORDER — MUPIROCIN 2 % EX OINT
1.0000 "application " | TOPICAL_OINTMENT | Freq: Two times a day (BID) | CUTANEOUS | Status: DC
  Filled 2019-02-25: qty 22

## 2019-02-25 MED ORDER — BUPIVACAINE-EPINEPHRINE (PF) 0.25% -1:200000 IJ SOLN
INTRAMUSCULAR | Status: AC
  Filled 2019-02-25: qty 30

## 2019-02-25 MED ORDER — SUGAMMADEX SODIUM 500 MG/5ML IV SOLN
INTRAVENOUS | Status: DC | PRN
  Administered 2019-02-25: 450 mg via INTRAVENOUS

## 2019-02-25 MED ORDER — PANTOPRAZOLE SODIUM 40 MG IV SOLR
40.0000 mg | Freq: Every day | INTRAVENOUS | Status: DC
  Administered 2019-02-25: 23:00:00 40 mg via INTRAVENOUS
  Filled 2019-02-25: qty 40

## 2019-02-25 MED ORDER — LIDOCAINE HCL (CARDIAC) PF 100 MG/5ML IV SOSY
PREFILLED_SYRINGE | INTRAVENOUS | Status: DC | PRN
  Administered 2019-02-25: 100 mg via INTRAVENOUS

## 2019-02-25 MED ORDER — ACETAMINOPHEN 500 MG PO TABS
1000.0000 mg | ORAL_TABLET | Freq: Four times a day (QID) | ORAL | Status: DC
  Administered 2019-02-26: 1000 mg via ORAL
  Filled 2019-02-25 (×2): qty 2

## 2019-02-25 MED ORDER — FENTANYL CITRATE (PF) 100 MCG/2ML IJ SOLN
INTRAMUSCULAR | Status: DC | PRN
  Administered 2019-02-25 (×2): 25 ug via INTRAVENOUS
  Administered 2019-02-25: 50 ug via INTRAVENOUS

## 2019-02-25 MED ORDER — FENTANYL CITRATE (PF) 100 MCG/2ML IJ SOLN
INTRAMUSCULAR | Status: AC
  Filled 2019-02-25: qty 2

## 2019-02-25 MED ORDER — ONDANSETRON HCL 4 MG/2ML IJ SOLN: 4.0000 mg | Freq: Once | INTRAMUSCULAR | Status: DC | PRN

## 2019-02-25 MED ORDER — PIPERACILLIN-TAZOBACTAM 3.375 G IVPB
3.3750 g | Freq: Three times a day (TID) | INTRAVENOUS | Status: DC
  Administered 2019-02-25: 09:00:00 3.375 g via INTRAVENOUS

## 2019-02-25 MED ORDER — ACETAMINOPHEN 10 MG/ML IV SOLN
INTRAVENOUS | Status: DC | PRN
  Administered 2019-02-25: 1000 mg via INTRAVENOUS

## 2019-02-25 MED ORDER — CHLORHEXIDINE GLUCONATE CLOTH 2 % EX PADS: 6.0000 | MEDICATED_PAD | Freq: Once | CUTANEOUS | Status: DC

## 2019-02-25 MED ORDER — HYDROMORPHONE HCL 1 MG/ML IJ SOLN
1.0000 mg | INTRAMUSCULAR | Status: AC
  Administered 2019-02-25: 02:00:00 1 mg via INTRAVENOUS
  Filled 2019-02-25: qty 1

## 2019-02-25 MED ORDER — DEXMEDETOMIDINE HCL IN NACL 200 MCG/50ML IV SOLN
INTRAVENOUS | Status: DC | PRN
  Administered 2019-02-25: 20 ug via INTRAVENOUS

## 2019-02-25 MED ORDER — KETOROLAC TROMETHAMINE 30 MG/ML IJ SOLN: 30.0000 mg | Freq: Four times a day (QID) | INTRAMUSCULAR | Status: DC | PRN

## 2019-02-25 MED ORDER — PIPERACILLIN-TAZOBACTAM 3.375 G IVPB
3.3750 g | Freq: Three times a day (TID) | INTRAVENOUS | Status: DC
  Administered 2019-02-25 – 2019-02-26 (×3): 3.375 g via INTRAVENOUS
  Filled 2019-02-25 (×4): qty 50

## 2019-02-25 MED ORDER — HYDROMORPHONE HCL 1 MG/ML IJ SOLN
INTRAMUSCULAR | Status: AC
  Administered 2019-02-25: 1 mg via INTRAVENOUS
  Filled 2019-02-25: qty 1

## 2019-02-25 MED ORDER — ONDANSETRON HCL 4 MG/2ML IJ SOLN
INTRAMUSCULAR | Status: DC | PRN
  Administered 2019-02-25: 4 mg via INTRAVENOUS

## 2019-02-25 MED ORDER — GABAPENTIN 400 MG PO CAPS
400.0000 mg | ORAL_CAPSULE | Freq: Three times a day (TID) | ORAL | Status: DC
  Filled 2019-02-25: qty 1

## 2019-02-25 MED ORDER — MIDAZOLAM HCL 2 MG/2ML IJ SOLN
INTRAMUSCULAR | Status: AC
  Filled 2019-02-25: qty 2

## 2019-02-25 MED ORDER — KETOROLAC TROMETHAMINE 30 MG/ML IJ SOLN
30.0000 mg | Freq: Four times a day (QID) | INTRAMUSCULAR | Status: DC
  Administered 2019-02-25 – 2019-02-26 (×2): 30 mg via INTRAVENOUS
  Filled 2019-02-25 (×2): qty 1

## 2019-02-25 MED ORDER — HYDRALAZINE HCL 20 MG/ML IJ SOLN: 10.0000 mg | INTRAMUSCULAR | Status: DC | PRN

## 2019-02-25 MED ORDER — HYDROMORPHONE HCL 1 MG/ML IJ SOLN
1.0000 mg | INTRAMUSCULAR | Status: DC | PRN
  Administered 2019-02-25 (×3): 1 mg via INTRAVENOUS
  Filled 2019-02-25 (×2): qty 1

## 2019-02-25 MED ORDER — HEPARIN SODIUM (PORCINE) 5000 UNIT/ML IJ SOLN
5000.0000 [IU] | Freq: Three times a day (TID) | INTRAMUSCULAR | Status: DC
  Administered 2019-02-25 – 2019-02-26 (×2): 5000 [IU] via SUBCUTANEOUS
  Filled 2019-02-25 (×2): qty 1

## 2019-02-25 MED ORDER — PROPOFOL 10 MG/ML IV BOLUS
INTRAVENOUS | Status: AC
  Filled 2019-02-25: qty 20

## 2019-02-25 MED ORDER — ONDANSETRON HCL 4 MG/2ML IJ SOLN
4.0000 mg | Freq: Four times a day (QID) | INTRAMUSCULAR | Status: DC | PRN
  Administered 2019-02-25 (×2): 4 mg via INTRAVENOUS
  Filled 2019-02-25: qty 2

## 2019-02-25 MED ORDER — ROCURONIUM BROMIDE 100 MG/10ML IV SOLN
INTRAVENOUS | Status: DC | PRN
  Administered 2019-02-25: 50 mg via INTRAVENOUS

## 2019-02-25 MED ORDER — DEXAMETHASONE SODIUM PHOSPHATE 10 MG/ML IJ SOLN
INTRAMUSCULAR | Status: DC | PRN
  Administered 2019-02-25: 10 mg via INTRAVENOUS

## 2019-02-25 MED ORDER — ROCURONIUM BROMIDE 50 MG/5ML IV SOLN
INTRAVENOUS | Status: AC
  Filled 2019-02-25: qty 1

## 2019-02-25 MED ORDER — LACTATED RINGERS IV SOLN
INTRAVENOUS | Status: DC | PRN
  Administered 2019-02-25: 16:00:00 via INTRAVENOUS

## 2019-02-25 MED ORDER — PROPOFOL 10 MG/ML IV BOLUS
INTRAVENOUS | Status: DC | PRN
  Administered 2019-02-25: 170 mg via INTRAVENOUS

## 2019-02-25 MED ORDER — SODIUM CHLORIDE 0.9 % IV BOLUS
1000.0000 mL | Freq: Once | INTRAVENOUS | Status: AC
  Administered 2019-02-25: 09:00:00 1000 mL via INTRAVENOUS

## 2019-02-25 MED ORDER — ONDANSETRON HCL 4 MG/2ML IJ SOLN
4.0000 mg | INTRAMUSCULAR | Status: AC
  Administered 2019-02-25: 4 mg via INTRAVENOUS
  Filled 2019-02-25: qty 2

## 2019-02-25 MED ORDER — BUPIVACAINE-EPINEPHRINE 0.25% -1:200000 IJ SOLN
INTRAMUSCULAR | Status: DC | PRN
  Administered 2019-02-25: 30 mL

## 2019-02-25 MED ORDER — DEXAMETHASONE SODIUM PHOSPHATE 10 MG/ML IJ SOLN
INTRAMUSCULAR | Status: AC
  Filled 2019-02-25: qty 1

## 2019-02-25 MED ORDER — PROCHLORPERAZINE MALEATE 10 MG PO TABS
10.0000 mg | ORAL_TABLET | Freq: Four times a day (QID) | ORAL | Status: DC | PRN
  Filled 2019-02-25: qty 1

## 2019-02-25 SURGICAL SUPPLY — 41 items
APPLICATOR COTTON TIP 6 STRL (MISCELLANEOUS) IMPLANT
APPLICATOR COTTON TIP 6IN STRL (MISCELLANEOUS)
APPLIER CLIP 5 13 M/L LIGAMAX5 (MISCELLANEOUS) ×2
CANISTER SUCT 1200ML W/VALVE (MISCELLANEOUS) ×2 IMPLANT
CHLORAPREP W/TINT 26 (MISCELLANEOUS) ×2 IMPLANT
CHOLANGIOGRAM CATH TAUT (CATHETERS) IMPLANT
CLIP APPLIE 5 13 M/L LIGAMAX5 (MISCELLANEOUS) ×1 IMPLANT
COVER WAND RF STERILE (DRAPES) IMPLANT
DECANTER SPIKE VIAL GLASS SM (MISCELLANEOUS) IMPLANT
DERMABOND ADVANCED (GAUZE/BANDAGES/DRESSINGS) ×1
DERMABOND ADVANCED .7 DNX12 (GAUZE/BANDAGES/DRESSINGS) ×1 IMPLANT
DRAPE C-ARM XRAY 36X54 (DRAPES) ×4 IMPLANT
ELECT CAUTERY BLADE 6.4 (BLADE) ×2 IMPLANT
ELECT REM PT RETURN 9FT ADLT (ELECTROSURGICAL) ×2
ELECTRODE REM PT RTRN 9FT ADLT (ELECTROSURGICAL) ×1 IMPLANT
GLOVE BIO SURGEON STRL SZ7 (GLOVE) ×6 IMPLANT
GOWN STRL REUS W/ TWL LRG LVL3 (GOWN DISPOSABLE) ×3 IMPLANT
GOWN STRL REUS W/TWL LRG LVL3 (GOWN DISPOSABLE) ×3
IRRIGATION STRYKERFLOW (MISCELLANEOUS) ×1 IMPLANT
IRRIGATOR STRYKERFLOW (MISCELLANEOUS) ×2
IV CATH ANGIO 12GX3 LT BLUE (NEEDLE) IMPLANT
IV NS 1000ML (IV SOLUTION) ×1
IV NS 1000ML BAXH (IV SOLUTION) ×1 IMPLANT
L-HOOK LAP DISP 36CM (ELECTROSURGICAL) ×2
LHOOK LAP DISP 36CM (ELECTROSURGICAL) ×1 IMPLANT
NEEDLE HYPO 22GX1.5 SAFETY (NEEDLE) ×2 IMPLANT
NS IRRIG 500ML POUR BTL (IV SOLUTION) ×2 IMPLANT
PACK LAP CHOLECYSTECTOMY (MISCELLANEOUS) ×2 IMPLANT
PENCIL ELECTRO HAND CTR (MISCELLANEOUS) ×2 IMPLANT
POUCH SPECIMEN RETRIEVAL 10MM (ENDOMECHANICALS) ×2 IMPLANT
SCISSORS METZENBAUM CVD 33 (INSTRUMENTS) ×2 IMPLANT
SET TUBE SMOKE EVAC HIGH FLOW (TUBING) ×2 IMPLANT
SLEEVE ENDOPATH XCEL 5M (ENDOMECHANICALS) ×4 IMPLANT
SPONGE LAP 18X18 RF (DISPOSABLE) ×2 IMPLANT
STOPCOCK 4 WAY LG BORE MALE ST (IV SETS) IMPLANT
SUT ETHIBOND 0 MO6 C/R (SUTURE) IMPLANT
SUT MNCRL AB 4-0 PS2 18 (SUTURE) ×2 IMPLANT
SUT VICRYL 0 AB UR-6 (SUTURE) ×4 IMPLANT
SYR 20CC LL (SYRINGE) ×2 IMPLANT
TROCAR XCEL BLUNT TIP 100MML (ENDOMECHANICALS) ×2 IMPLANT
TROCAR XCEL NON-BLD 5MMX100MML (ENDOMECHANICALS) ×2 IMPLANT

## 2019-02-25 NOTE — ED Notes (Signed)
Report to ashley, rn

## 2019-02-25 NOTE — Op Note (Signed)
Laparoscopic Cholecystectomy  Pre-operative Diagnosis: Cholecystitis  Post-operative Diagnosis: same  Procedure: laparoscopic cholecystectomy  Surgeon: Caroleen Hamman, MD FACS  Anesthesia: Gen. with endotracheal tube   Findings: Acute Cholecystitis  Fatty liver  Estimated Blood Loss: 10cc                 Specimens: Gallbladder           Complications: none   Procedure Details  The patient was seen again in the Holding Room. The benefits, complications, treatment options, and expected outcomes were discussed with the patient. The risks of bleeding, infection, recurrence of symptoms, failure to resolve symptoms, bile duct damage, bile duct leak, retained common bile duct stone, bowel injury, any of which could require further surgery and/or ERCP, stent, or papillotomy were reviewed with the patient. The likelihood of improving the patient's symptoms with return to their baseline status is good.  The patient and/or family concurred with the proposed plan, giving informed consent.  The patient was taken to Operating Room, identified as Linda Bennett and the procedure verified as Laparoscopic Cholecystectomy.  A Time Out was held and the above information confirmed.  Prior to the induction of general anesthesia, antibiotic prophylaxis was administered. VTE prophylaxis was in place. General endotracheal anesthesia was then administered and tolerated well. After the induction, the abdomen was prepped with Chloraprep and draped in the sterile fashion. The patient was positioned in the supine position.  Cut down technique was used to enter the abdominal cavity and a Hasson trochar was placed after two vicryl stitches were anchored to the fascia. Pneumoperitoneum was then created with CO2 and tolerated well without any adverse changes in the patient's vital signs.  Three 5-mm ports were placed in the right upper quadrant all under direct vision. All skin incisions  were infiltrated with a local  anesthetic agent before making the incision and placing the trocars.   The patient was positioned  in reverse Trendelenburg, tilted slightly to the patient's left. Liver was large due to fatty deposit and made the exposure more challenging. The gallbladder was identified, the fundus grasped and retracted cephalad. Adhesions were lysed bluntly. The infundibulum was grasped and retracted laterally, exposing the peritoneum overlying the triangle of Calot. This was then divided and exposed in a blunt fashion. An extended critical view of the cystic duct and cystic artery was obtained.  The cystic duct was clearly identified and bluntly dissected.   Artery and duct were double clipped and divided.  The gallbladder was taken from the gallbladder fossa in a retrograde fashion with the electrocautery. The gallbladder was removed and placed in an Endocatch bag. The liver bed was irrigated and inspected. Hemostasis was achieved with the electrocautery. Copious irrigation was utilized and was repeatedly aspirated until clear.  The gallbladder and Endocatch sac were then removed through a port site.     Inspection of the right upper quadrant was performed. No bleeding, bile duct injury or leak, or bowel injury was noted. Pneumoperitoneum was released.  The periumbilical port site was closed with interrumpted 0 Vicryl sutures. 4-0 subcuticular Monocryl was used to close the skin. Dermabond was  applied.  The patient was then extubated and brought to the recovery room in stable condition. Sponge, lap, and needle counts were correct at closure and at the conclusion of the case.               Caroleen Hamman, MD, FACS

## 2019-02-25 NOTE — ED Notes (Signed)
Patient transported to MRI 

## 2019-02-25 NOTE — Progress Notes (Signed)
Pharmacy Antibiotic Note  Linda Bennett is a 53 y.o. female admitted on 02/25/2019 with intra-abdominal infx.  Pharmacy has been consulted for zosyn dosing.  Plan: Zosyn 3.375g IV q8h (4 hour infusion).  Height: 5\' 6"  (167.6 cm) Weight: 235 lb (106.6 kg) IBW/kg (Calculated) : 59.3  Temp (24hrs), Avg:98.4 F (36.9 C), Min:98.4 F (36.9 C), Max:98.4 F (36.9 C)  Recent Labs  Lab 02/25/19 0140  WBC 14.0*  CREATININE 0.91    Estimated Creatinine Clearance: 89.3 mL/min (by C-G formula based on SCr of 0.91 mg/dL).    Allergies  Allergen Reactions  . Hydrocodone Itching  . Oxycodone Itching  . Tramadol Itching    Thank you for allowing pharmacy to be a part of this patient's care.  Tobie Lords, PharmD, BCPS Clinical Pharmacist 02/25/2019

## 2019-02-25 NOTE — ED Notes (Signed)
Pt states she if feeling "better". Pt appears more comfortable. Pt updated on turn around of results of labs and ultrasound. Pt verbalizes understanding.

## 2019-02-25 NOTE — ED Notes (Signed)
Pt returned from mri

## 2019-02-25 NOTE — ED Provider Notes (Signed)
[facility] Emergency Department Provider Note  ____________________________________________   First MD Initiated Contact with Patient 02/25/19 0143     (approximate)  I have reviewed the triage vital signs and the nursing notes.   HISTORY  Chief Complaint Abdominal Pain    HPI Linda Bennett is a 53 y.o. female with medical history as listed below who presents by EMS for evaluation of acute onset and severe upper abdominal pain that radiates through to the back.  She reports that it started about 8:30 PM.  She had dinner around 4 PM.  She has had at least 3 prior episodes that were similar but much more mild.  The prior episodes only lasted about an hour but this is persisted now for almost 6 hours.  She has had nausea but no vomiting.  She had a normal bowel movement earlier in the day.  She describes the pain as sharp and aching and nothing in particular makes it better or worse.  It is constant.  She has had no numbness or tingling in her arms or her legs.  She denies chest pain, shortness of breath fever/chills,  sore throat, vomiting, lower abdominal pain, and dysuria.  She has had no contact with anyone known to have COVID-19.  Of note she states that she has seen a GI doctor at [facility] and no one could figure out why she was having the pain.  She has had an upper and lower endoscopy.  She believes that her work-up was within the last year.  She does not remember ever having an ultrasound.  She has never had a cholecystectomy.        Past Medical History:  Diagnosis Date   Celiac disease    Hidradenitis suppurativa    High cholesterol    Pre-diabetes    Psoriasis    Psoriatic arthritis ([place])    Vitamin D deficiency     There are no active problems to display for this patient.   Past Surgical History:  Procedure Laterality Date   BACK SURGERY     SHOULDER SURGERY     TONSILLECTOMY     TUBAL LIGATION      Prior to Admission  medications   Medication Sig Start Date End Date Taking? Authorizing Provider  Apremilast 10 & 20 & 30 MG TBPK Take 30 mg by mouth 2 (two) times daily. Day 1: 10 mg in the AM.  Day 2: 10 mg twice daily  Day 3: 10 mg in the AM, 20 mg in the PM    Day 4: 20 mg twice daily   Day 5: 20 mg in the AM, 30 mg in the PM  30 mg twice daily daily thereafter. 08/04/18   Cuthriell, Charline Bills, PA-C  cephALEXin (KEFLEX) 500 MG capsule Take 1 capsule (500 mg total) by mouth 3 (three) times daily. 03/25/16   Carrie Mew, MD  diclofenac (VOLTAREN) 75 MG EC tablet Take 1 tablet (75 mg total) by mouth 2 (two) times daily. 07/24/15   Beers, Pierce Crane, PA-C  HYDROcodone-acetaminophen (NORCO) 5-325 MG tablet Take 1-2 tablets by mouth every 4 (four) hours as needed for moderate pain. 07/24/15   Beers, Pierce Crane, PA-C  meloxicam (MOBIC) 15 MG tablet Take 1 tablet (15 mg total) by mouth daily. 08/04/18   Cuthriell, Charline Bills, PA-C  oxyCODONE-acetaminophen (ROXICET) 5-325 MG tablet Take 1 tablet by mouth every 6 (six) hours as needed for severe pain. 03/25/16   Carrie Mew, MD  sulfamethoxazole-trimethoprim (BACTRIM DS) 800-160 MG tablet Take 1 tablet by mouth 2 (two) times daily. 03/25/16   Carrie Mew, MD    Allergies Hydrocodone; Oxycodone; and Tramadol  No family history on file.  Social History Social History   Tobacco Use   Smoking status: Current Every Day Smoker    Packs/day: 0.50    Types: Cigarettes   Smokeless tobacco: Never Used  Substance Use Topics   Alcohol use: No   Drug use: No    Review of Systems Constitutional: No fever/chills Eyes: No visual changes. ENT: No sore throat. Cardiovascular: Denies chest pain. Respiratory: Denies shortness of breath. Gastrointestinal: Upper abdominal pain with nausea, no vomiting, no constipation or diarrhea. Genitourinary: Negative for dysuria. Musculoskeletal: Negative for neck pain.  Some pain from the upper abdomen is radiating  through to the back. Integumentary: Negative for rash. Neurological: Negative for headaches, focal weakness or numbness.   ____________________________________________   PHYSICAL EXAM:  VITAL SIGNS: ED Triage Vitals [02/25/19 0135]  Enc Vitals Group     BP 140/72     Pulse Rate 70     Resp (!) 22     Temp 98.4 F (36.9 C)     Temp Source Oral     SpO2 100 %     Weight 106.6 kg (235 lb)     Height 1.676 m (5\' 6" )     Head Circumference      Peak Flow      Pain Score 10     Pain Loc      Pain Edu?      Excl. in [place]?     Constitutional: Alert and oriented.  Appears to be in severe pain and is tearful. Eyes: Conjunctivae are normal.  Head: Atraumatic. Nose: No congestion/rhinnorhea. Mouth/Throat: Mucous membranes are moist. Neck: No stridor.  No meningeal signs.   Cardiovascular: Normal rate, regular rhythm. Good peripheral circulation. Grossly normal heart sounds. Respiratory: Normal respiratory effort.  No retractions. No audible wheezing. Gastrointestinal: Soft and nondistended.  Obese.  Severe tenderness to palpation of the epigastrium and right upper quadrant with positive Murphy sign.  No lower abdominal tenderness with no rebound or guarding in the lower abdomen. Musculoskeletal: No lower extremity tenderness nor edema. No gross deformities of extremities. Neurologic:  Normal speech and language. No gross focal neurologic deficits are appreciated.  Skin:  Skin is warm, dry and intact. No rash noted. Psychiatric: Mood and affect are tearful and she does appear to be in severe pain.  ____________________________________________   LABS (all labs ordered are listed, but only abnormal results are displayed)  Labs Reviewed  COMPREHENSIVE METABOLIC PANEL - Abnormal; Notable for the following components:      Result Value   Glucose, Bld 165 (*)    AST 396 (*)    ALT 187 (*)    Alkaline Phosphatase 188 (*)    Total Bilirubin 1.4 (*)    All other components within  normal limits  CBC - Abnormal; Notable for the following components:   WBC 14.0 (*)    All other components within normal limits  SARS CORONAVIRUS 2 (HOSPITAL ORDER, [place] [facility])  LIPASE, BLOOD  TROPONIN I   ____________________________________________  EKG  ED ECG REPORT I, Hinda Kehr, the attending physician, personally viewed and interpreted this ECG.  Date: 02/25/2019 EKG Time: 1:49 AM Rate: 66 Rhythm: normal sinus rhythm QRS Axis: normal Intervals: normal ST/T Wave abnormalities: Non-specific ST segment / T-wave changes, but no clear  evidence of acute ischemia. Narrative Interpretation: no definitive evidence of acute ischemia; does not meet STEMI criteria.   ____________________________________________  RADIOLOGY   ED MD interpretation: Cholecystitis without evidence of choledocholithiasis  Official radiology report(s): Mr 3d Recon At Scanner  Result Date: 02/25/2019 CLINICAL DATA:  53 year old female with history of epigastric and right upper quadrant abdominal pain with nausea. Cholelithiasis. EXAM: MRI ABDOMEN WITHOUT AND WITH CONTRAST (INCLUDING MRCP) TECHNIQUE: Multiplanar multisequence MR imaging of the abdomen was performed both before and after the administration of intravenous contrast. Heavily T2-weighted images of the biliary and pancreatic ducts were obtained, and three-dimensional MRCP images were rendered by post processing. CONTRAST:  10 mL of Gadavist. COMPARISON:  No prior abdominal MRI. Abdominal ultrasound 02/25/2019. FINDINGS: Lower chest: Unremarkable. Hepatobiliary: No discrete cystic or solid hepatic lesions. No intra or extrahepatic biliary ductal dilatation noted on MRCP images. Common bile duct measures 5 mm in the porta hepatis. No filling defects in the common bile duct to suggest choledocholithiasis. Numerous small filling defects lying dependently in the gallbladder, compatible with cholelithiasis. Gallbladder is not  distended. Gallbladder wall thickness is increased, predominantly related to gallbladder wall edema, measuring up to 12 mm. Trace volume of pericholecystic fluid. Increased enhancement of the gallbladder mucosa. Pancreas: No pancreatic mass. No pancreatic ductal dilatation noted on MRCP images. No pancreatic or peripancreatic fluid collections or inflammatory changes. Spleen:  Unremarkable. Adrenals/Urinary Tract: Bilateral kidneys and adrenal glands are normal in appearance. No hydroureteronephrosis in the visualized portions of the abdomen. Stomach/Bowel: Visualized portions are unremarkable. Vascular/Lymphatic: Aortic atherosclerosis, without evidence of aneurysm or dissection in the abdominal vasculature. No lymphadenopathy noted in the abdomen. Other: No significant volume of ascites noted in the visualized portions of the peritoneal cavity. Musculoskeletal: No aggressive appearing osseous lesions are noted in the visualized portions of the skeleton. IMPRESSION: 1. Cholelithiasis with evidence of gallbladder wall edema, mucosal hyperenhancement and trace volume of pericholecystic fluid. Although the gallbladder is not distended, these findings are concerning for very early acute cholecystitis. 2. No evidence of choledocholithiasis or biliary tract obstruction. 3. Aortic atherosclerosis. Electronically Signed   By: Vinnie Langton M.D.   On: 02/25/2019 06:45   Mr Abdomen Mrcp Moise Boring Contast  Result Date: 02/25/2019 CLINICAL DATA:  52 year old female with history of epigastric and right upper quadrant abdominal pain with nausea. Cholelithiasis. EXAM: MRI ABDOMEN WITHOUT AND WITH CONTRAST (INCLUDING MRCP) TECHNIQUE: Multiplanar multisequence MR imaging of the abdomen was performed both before and after the administration of intravenous contrast. Heavily T2-weighted images of the biliary and pancreatic ducts were obtained, and three-dimensional MRCP images were rendered by post processing. CONTRAST:  10 mL of  Gadavist. COMPARISON:  No prior abdominal MRI. Abdominal ultrasound 02/25/2019. FINDINGS: Lower chest: Unremarkable. Hepatobiliary: No discrete cystic or solid hepatic lesions. No intra or extrahepatic biliary ductal dilatation noted on MRCP images. Common bile duct measures 5 mm in the porta hepatis. No filling defects in the common bile duct to suggest choledocholithiasis. Numerous small filling defects lying dependently in the gallbladder, compatible with cholelithiasis. Gallbladder is not distended. Gallbladder wall thickness is increased, predominantly related to gallbladder wall edema, measuring up to 12 mm. Trace volume of pericholecystic fluid. Increased enhancement of the gallbladder mucosa. Pancreas: No pancreatic mass. No pancreatic ductal dilatation noted on MRCP images. No pancreatic or peripancreatic fluid collections or inflammatory changes. Spleen:  Unremarkable. Adrenals/Urinary Tract: Bilateral kidneys and adrenal glands are normal in appearance. No hydroureteronephrosis in the visualized portions of the abdomen. Stomach/Bowel: Visualized portions are unremarkable.  Vascular/Lymphatic: Aortic atherosclerosis, without evidence of aneurysm or dissection in the abdominal vasculature. No lymphadenopathy noted in the abdomen. Other: No significant volume of ascites noted in the visualized portions of the peritoneal cavity. Musculoskeletal: No aggressive appearing osseous lesions are noted in the visualized portions of the skeleton. IMPRESSION: 1. Cholelithiasis with evidence of gallbladder wall edema, mucosal hyperenhancement and trace volume of pericholecystic fluid. Although the gallbladder is not distended, these findings are concerning for very early acute cholecystitis. 2. No evidence of choledocholithiasis or biliary tract obstruction. 3. Aortic atherosclerosis. Electronically Signed   By: Vinnie Langton M.D.   On: 02/25/2019 06:45   US Abdomen Limited Ruq  Result Date: 02/25/2019 CLINICAL  DATA:  53 y/o F; epigastric and right upper quadrant pain with nausea. EXAM: ULTRASOUND ABDOMEN LIMITED RIGHT UPPER QUADRANT COMPARISON:  None. FINDINGS: Gallbladder: Multiple mobile echogenic gallstones measuring up to 10 mm. A nonmobile stone is present in the gallbladder neck. Borderline gallbladder wall measures 3.5 mm. Negative sonographic Murphy's sign. Common bile duct: Diameter: 3.9 mm Liver: No focal lesion. Diffusely increased liver echogenicity compatible with steatosis. Portal vein is patent on color Doppler imaging with normal direction of blood flow towards the liver. IMPRESSION: Mild gallbladder wall thickening and cholelithiasis including a nonmobile stone in the gallbladder neck. Findings may represent acute cholecystitis in the appropriate clinical setting. Hepatic steatosis. Electronically Signed   By: Kristine Garbe M.D.   On: 02/25/2019 02:47    ____________________________________________   PROCEDURES   Procedure(s) performed (including Critical Care):  Procedures   ____________________________________________   INITIAL IMPRESSION / MDM / [place] / ED COURSE  As part of my medical decision making, I reviewed the following data within the [place] notes reviewed and incorporated, Labs reviewed , EKG interpreted , Discussed with general surgery (Dr. Dahlia Byes) and Notes from prior ED visits      *Linda Bennett was evaluated in Emergency Department on 02/25/2019 for the symptoms described in the history of present illness. She was evaluated in the context of the global COVID-19 pandemic, which necessitated consideration that the patient might be at risk for infection with the SARS-CoV-2 virus that causes COVID-19. Institutional protocols and algorithms that pertain to the evaluation of patients at risk for COVID-19 are in a state of rapid change based on information released by regulatory bodies including the CDC and federal and  state organizations. These policies and algorithms were followed during the patient's care in the ED.  Some ED evaluations and interventions may be delayed as a result of limited staffing during the pandemic.*  Differential diagnosis includes, but is not limited to, gallbladder disease, pancreatitis, gastric or duodenal ulcer, less likely diagnoses would include aortic dissection, ACS, PE, pneumonia, renal colic.  Based on the patient's symptoms and physical exam I strongly suspect biliary disease.  I reviewed the medical record and it looks like [facility] mostly evaluated her for possible IBD.  She had an abdominal ultrasound but this was back in 2017.  Given the degree of pain she is experiencing which 50 mcg of fentanyl received prior to arrival barely touch, I am giving her Dilaudid 1 mg IV and Zofran 4 mg IV.  Lab work is pending and I ordered a right upper quadrant ultrasound.  If this is normal or nondiagnostic anticipate obtaining CT imaging likely a CTA chest/abdomen/pelvis.  She is having no neurological symptoms which is reassuring and no infectious symptoms currently.  Clinical Course as of Feb 25 707  Sat Feb 25, 2019  6979 SARS Coronavirus 2: NEGATIVE [CF]  [phone] The patient's ultrasound shows multiple mobile gallstones with a nonmobile stone in the neck of the gallbladder.  The common bile duct was within normal limits and the overall impression from the radiologist was likely cholecystitis.I reassessed the patient and her pain is much improved but she still has a positive Murphy sign with tenderness to palpation in the right upper quadrant.I called and spoke by phone with Dr. Dahlia Byes.  He said that the patient needs an MRCP prior to being admitted by him for surgery to rule out choledocholithiasis.  I have ordered the MR abdomen MRCP protocol and Dr. Dahlia Byes will be contacted again once the results are back.   [CF]  0703 Cholecystitis without evidence of choledocholithiasis.  I am paging Dr. Dahlia Byes    MR ABDOMEN MRCP Moise Boring CONTAST [CF]  6553 Discussed case by phone with Dr. Dahlia Byes who will admit.   [CF]    Clinical Course User Index [CF] Hinda Kehr, MD     ____________________________________________  FINAL CLINICAL IMPRESSION(S) / ED DIAGNOSES  Final diagnoses:  Acute cholecystitis     MEDICATIONS GIVEN DURING THIS VISIT:  Medications  HYDROmorphone (DILAUDID) injection 1 mg (1 mg Intravenous Given 02/25/19 0157)  ondansetron (ZOFRAN) injection 4 mg (4 mg Intravenous Given 02/25/19 0157)  gadobutrol (GADAVIST) 1 MMOL/ML injection 10 mL (10 mLs Intravenous Contrast Given 02/25/19 0612)     ED Discharge Orders    None       Note:  This document was prepared using Dragon voice recognition software and may include unintentional dictation errors.   Hinda Kehr, MD 02/25/19 [phone]

## 2019-02-25 NOTE — Anesthesia Procedure Notes (Signed)
Procedure Name: Intubation Date/Time: 02/25/2019 4:21 PM Performed by: Doreen Salvage, CRNA Pre-anesthesia Checklist: Patient identified, Patient being monitored, Timeout performed, Emergency Drugs available and Suction available Patient Re-evaluated:Patient Re-evaluated prior to induction Oxygen Delivery Method: Circle system utilized Preoxygenation: Pre-oxygenation with 100% oxygen Induction Type: IV induction Ventilation: Mask ventilation without difficulty Laryngoscope Size: Mac and 3 Grade View: Grade I Tube type: Oral Tube size: 7.0 mm Number of attempts: 2 Airway Equipment and Method: Stylet Placement Confirmation: ETT inserted through vocal cords under direct vision,  positive ETCO2 and breath sounds checked- equal and bilateral Secured at: 21 cm Tube secured with: Tape Dental Injury: Teeth and Oropharynx as per pre-operative assessment

## 2019-02-25 NOTE — ED Triage Notes (Signed)
Pt with epigastric pain that radiates to back for approx one hour. Pt states has had nausea, denies known fever. Ems gave 11mcg fentanyl and 4mg  zofran.

## 2019-02-25 NOTE — Anesthesia Preprocedure Evaluation (Signed)
Anesthesia Evaluation  Patient identified by MRN, date of birth, ID band Patient awake    Reviewed: Allergy & Precautions, H&P , NPO status , Patient's Chart, lab work & pertinent test results, reviewed documented beta blocker date and time   Airway Mallampati: II  TM Distance: >3 FB Neck ROM: full    Dental  (+) Teeth Intact   Pulmonary neg pulmonary ROS, Current Smoker,    Pulmonary exam normal        Cardiovascular Exercise Tolerance: Good negative cardio ROS Normal cardiovascular exam Rhythm:regular Rate:Normal     Neuro/Psych negative neurological ROS  negative psych ROS   GI/Hepatic negative GI ROS, Neg liver ROS,   Endo/Other  negative endocrine ROS  Renal/GU negative Renal ROS  negative genitourinary   Musculoskeletal   Abdominal   Peds  Hematology negative hematology ROS (+)   Anesthesia Other Findings Past Medical History: No date: Celiac disease No date: Hidradenitis suppurativa No date: High cholesterol No date: Pre-diabetes No date: Psoriasis No date: Psoriatic arthritis ([facility]) No date: Vitamin D deficiency Past Surgical History: No date: BACK SURGERY No date: SHOULDER SURGERY No date: TONSILLECTOMY No date: TUBAL LIGATION BMI    Body Mass Index:  37.93 kg/m     Reproductive/Obstetrics negative OB ROS                             Anesthesia Physical Anesthesia Plan  ASA: II and emergent  Anesthesia Plan: General ETT   Post-op Pain Management:    Induction:   PONV Risk Score and Plan: 3  Airway Management Planned:   Additional Equipment:   Intra-op Plan:   Post-operative Plan:   Informed Consent: I have reviewed the patients History and Physical, chart, labs and discussed the procedure including the risks, benefits and alternatives for the proposed anesthesia with the patient or authorized representative who has indicated his/her understanding and  acceptance.     Dental Advisory Given  Plan Discussed with: CRNA  Anesthesia Plan Comments:         Anesthesia Quick Evaluation

## 2019-02-25 NOTE — ED Notes (Signed)
Pt returned from US

## 2019-02-25 NOTE — H&P (Signed)
Patient ID: Linda Bennett, female   DOB: 08-24-1966, 53 y.o.   MRN: [id number]  HPI Linda Bennett is a 53 y.o. female a 12-day history of severe abdominal pain.  She reports that her pain is in the epigastric area and also in the right upper quadrant.  The pain is severe as lasted several hours and also radiates to the back.  The pain is sharp in nature.  She did have nausea but no vomiting.  The pain is started last night and she had fried shrimp and fried fish for supper.  She did have a similar episode in December.  No family history of gallbladder disease.  Only abdominal operation is a tubal ligation.  As part of the work-up including an ultrasound as well as an MRCP that I have personally reviewed showing evidence of cholelithiasis with cholecystitis.  No evidence of choledocholithiasis. White count is 14,000 with a hemoglobin of 12 AST ALT and alkaline phosphatase are all elevated with a total bilirubin of 1.4.  She denies any fevers or chills and no evidence of biliary obstruction or jaundice or cholangitis She Is able to perform more than 4 METS of activity without any shortness of breath or chest pain.  She is currently on disability She smokes 1/2 pack Day.  HPI  Past Medical History:  Diagnosis Date  . Celiac disease   . Hidradenitis suppurativa   . High cholesterol   . Pre-diabetes   . Psoriasis   . Psoriatic arthritis ([place])   . Vitamin D deficiency     Past Surgical History:  Procedure Laterality Date  . BACK SURGERY    . SHOULDER SURGERY    . TONSILLECTOMY    . TUBAL LIGATION      No relevant Fam hx  Social History Social History   Tobacco Use  . Smoking status: Current Every Day Smoker    Packs/day: 0.50    Types: Cigarettes  . Smokeless tobacco: Never Used  Substance Use Topics  . Alcohol use: No  . Drug use: No    Allergies  Allergen Reactions  . Gluten Meal Other (See Comments)    Celiac disease Other reaction(s): Other (See Comments) Celiac  disease Other reaction(s): has celiac Celiac disease   . Hydrocodone Itching  . Oxycodone Itching  . Tramadol Itching    Current Facility-Administered Medications  Medication Dose Route Frequency Provider Last Rate Last Dose  . 0.9 %  sodium chloride infusion   Intravenous Continuous Pabon, Diego F, MD      . acetaminophen (TYLENOL) tablet 1,000 mg  1,000 mg Oral Q6H Pabon, Diego F, MD      . Chlorhexidine Gluconate Cloth 2 % PADS 6 each  6 each Topical Once Pabon, Marjory Lies, MD       And  . Chlorhexidine Gluconate Cloth 2 % PADS 6 each  6 each Topical Once Pabon, [place] F, MD      . heparin injection 5,000 Units  5,000 Units Subcutaneous Q8H Pabon, Diego F, MD      . hydrALAZINE (APRESOLINE) injection 10 mg  10 mg Intravenous Q2H PRN Pabon, Diego F, MD      . HYDROmorphone (DILAUDID) injection 1 mg  1 mg Intravenous Q3H PRN Jules Husbands, MD   1 mg at 02/25/19 0816  . ketorolac (TORADOL) 30 MG/ML injection 30 mg  30 mg Intravenous Q6H PRN Pabon, Diego F, MD      . ondansetron (ZOFRAN) 4 MG/2ML injection           .  ondansetron (ZOFRAN-ODT) disintegrating tablet 4 mg  4 mg Oral Q6H PRN Pabon, Diego F, MD       Or  . ondansetron (ZOFRAN) injection 4 mg  4 mg Intravenous Q6H PRN Jules Husbands, MD   4 mg at 02/25/19 0823  . pantoprazole (PROTONIX) injection 40 mg  40 mg Intravenous QHS Pabon, Diego F, MD      . piperacillin-tazobactam (ZOSYN) IVPB 3.375 g  3.375 g Intravenous Q8H Pabon, Diego F, MD      . prochlorperazine (COMPAZINE) tablet 10 mg  10 mg Oral Q6H PRN Pabon, Diego F, MD       Or  . prochlorperazine (COMPAZINE) injection 5-10 mg  5-10 mg Intravenous Q6H PRN Pabon, Marjory Lies, MD       Current Outpatient Medications  Medication Sig Dispense Refill  . Multiple Vitamin (MULTIVITAMIN) tablet Take 1 tablet by mouth daily.    . pantoprazole (PROTONIX) 40 MG tablet Take 40 mg by mouth daily.       Review of Systems Full ROS  was asked and was negative except for the information  on the HPI  Physical Exam Blood pressure 131/80, pulse (!) 58, temperature 98.4 F (36.9 C), temperature source Oral, resp. rate 18, height 5\' 6"  (1.676 m), weight 106.6 kg, last menstrual period 03/05/2016, SpO2 98 %. CONSTITUTIONAL: SHe c/o and moaning due to the pain EYES: Pupils are equal, round, and reactive to light, Sclera are non-icteric. EARS, NOSE, MOUTH AND THROAT: The oropharynx is clear. The oral mucosa is pink and moist. Hearing is intact to voice. LYMPH NODES:  Lymph nodes in the neck are normal. RESPIRATORY:  Lungs are clear. There is normal respiratory effort, with equal breath sounds bilaterally, and without pathologic use of accessory muscles. CARDIOVASCULAR: Heart is regular without murmurs, gallops, or rubs. GI: The abdomen is soft, + Murphy sign. No peritonitis. No masses or organomegalies. + BS. GU: Rectal deferred.   MUSCULOSKELETAL: Normal muscle strength and tone. No cyanosis or edema.   SKIN: Turgor is good and there are no pathologic skin lesions or ulcers. NEUROLOGIC: Motor and sensation is grossly normal. Cranial nerves are grossly intact. PSYCH:  Oriented to person, place and time. Affect is normal.  Data Reviewed  I have personally reviewed the patient's imaging, laboratory findings and medical records.    Assessment/Plan 53 year old female with clinical findings consistent with acute cholecystitis confirmed by ultrasound and MRCP.  We will start IV antibiotics crystalloid resuscitation and perform cholecystectomy today as OR schedule allows. The risks, benefits, complications, treatment options, and expected outcomes were discussed with the patient. The possibilities of bleeding, recurrent infection, finding a normal gallbladder, perforation of viscus organs, damage to surrounding structures, bile leak, abscess formation, needing a drain placed, the need for additional procedures, reaction to medication, pulmonary aspiration,  failure to diagnose a condition,  the possible need to convert to an open procedure, and creating a complication requiring transfusion or operation were discussed with the patient. The patient and/or family concurred with the proposed plan, giving informed consent.  Extensive counseling provided to the patient.  I Have already called the OR and will likely do her surgery this afternoon.  Caroleen Hamman, MD FACS General Surgeon 02/25/2019, 9:21 AM

## 2019-02-25 NOTE — ED Notes (Signed)
ED TO INPATIENT HANDOFF REPORT  ED Nurse Name and Phone #: Berenda Morale RN [phone]  S Name/Age/Gender Linda Bennett 53 y.o. female Room/Bed: ED05A/ED05A  Code Status   Code Status: Full Code  Home/SNF/Other Home Patient oriented to: self, place, time and situation Is this baseline? Yes   Triage Complete: Triage complete  Chief Complaint [facility] Abd Pain  Triage Note Pt with epigastric pain that radiates to back for approx one hour. Pt states has had nausea, denies known fever. Ems gave 31mcg fentanyl and 4mg  zofran.    Allergies Allergies  Allergen Reactions  . Gluten Meal Other (See Comments)    Celiac disease Other reaction(s): Other (See Comments) Celiac disease Other reaction(s): has celiac Celiac disease   . Hydrocodone Itching  . Oxycodone Itching  . Tramadol Itching    Level of Care/Admitting Diagnosis ED Disposition    ED Disposition Condition Comment   Admit  Hospital Area: [facility] [[id number]]  Level of Care: Med-Surg [16]  Covid Evaluation: N/A  Diagnosis: Cholecystitis [371696]  Admitting Physician: Jules Husbands [[id number]]  Attending Physician: Jules Husbands [[id number]]  PT Class (Do Not Modify): Observation [104]  PT Acc Code (Do Not Modify): Observation [10022]       B Medical/Surgery History Past Medical History:  Diagnosis Date  . Celiac disease   . Hidradenitis suppurativa   . High cholesterol   . Pre-diabetes   . Psoriasis   . Psoriatic arthritis ([place])   . Vitamin D deficiency    Past Surgical History:  Procedure Laterality Date  . BACK SURGERY    . SHOULDER SURGERY    . TONSILLECTOMY    . TUBAL LIGATION       A IV Location/Drains/Wounds Patient Lines/Drains/Airways Status   Active Line/Drains/Airways    Name:   Placement date:   Placement time:   Site:   Days:   Peripheral IV 02/25/19 Left Antecubital   02/25/19    -    Antecubital   less than 1          Intake/Output Last 24 hours No  intake or output data in the 24 hours ending 02/25/19 0847  Labs/Imaging Results for orders placed or performed during the hospital encounter of 02/25/19 (from the past 48 hour(s))  Lipase, blood     Status: None   Collection Time: 02/25/19  1:40 AM  Result Value Ref Range   Lipase 49 11 - 51 U/L    Comment: Performed at [facility], [address] [place], [place] [address]  Comprehensive metabolic panel     Status: Abnormal   Collection Time: 02/25/19  1:40 AM  Result Value Ref Range   Sodium 139 135 - 145 mmol/L   Potassium 4.0 3.5 - 5.1 mmol/L   Chloride 105 98 - 111 mmol/L   CO2 25 22 - 32 mmol/L   Glucose, Bld 165 (H) 70 - 99 mg/dL   BUN 17 6 - 20 mg/dL   Creatinine, Ser 0.91 0.44 - 1.00 mg/dL   Calcium 9.2 8.9 - 10.3 mg/dL   Total Protein 7.8 6.5 - 8.1 g/dL   Albumin 3.9 3.5 - 5.0 g/dL   AST 396 (H) 15 - 41 U/L   ALT 187 (H) 0 - 44 U/L   Alkaline Phosphatase 188 (H) 38 - 126 U/L   Total Bilirubin 1.4 (H) 0.3 - 1.2 mg/dL   GFR calc non Af Amer >60 >60 mL/min   GFR calc Af Amer >  60 >60 mL/min   Anion gap 9 5 - 15    Comment: Performed at [facility], [address] [place], [place] [address]  CBC     Status: Abnormal   Collection Time: 02/25/19  1:40 AM  Result Value Ref Range   WBC 14.0 (H) 4.0 - 10.5 K/uL   RBC 4.66 3.87 - 5.11 MIL/uL   Hemoglobin 12.2 12.0 - 15.0 g/dL   HCT 38.5 36.0 - 46.0 %   MCV 82.6 80.0 - 100.0 fL   MCH 26.2 26.0 - 34.0 pg   MCHC 31.7 30.0 - 36.0 g/dL   RDW 13.2 11.5 - 15.5 %   Platelets 308 150 - 400 K/uL   nRBC 0.0 0.0 - 0.2 %    Comment: Performed at [facility], [address] [place], [place] [address]  Troponin I - ONCE - STAT     Status: None   Collection Time: 02/25/19  1:40 AM  Result Value Ref Range   Troponin I <0.03 <0.03 ng/mL    Comment: Performed at [facility], [address] [place], [place] [address]  SARS Coronavirus 2 (CEPHEID - Performed in [place] hospital lab),  Hosp Order     Status: None   Collection Time: 02/25/19  1:40 AM  Result Value Ref Range   SARS Coronavirus 2 NEGATIVE NEGATIVE    Comment: (NOTE) If result is NEGATIVE SARS-CoV-2 target nucleic acids are NOT DETECTED. The SARS-CoV-2 RNA is generally detectable in upper and lower  respiratory specimens during the acute phase of infection. The lowest  concentration of SARS-CoV-2 viral copies this assay can detect is 250  copies / mL. A negative result does not preclude SARS-CoV-2 infection  and should not be used as the sole basis for treatment or other  patient management decisions.  A negative result may occur with  improper specimen collection / handling, submission of specimen other  than nasopharyngeal swab, presence of viral mutation(s) within the  areas targeted by this assay, and inadequate number of viral copies  (<250 copies / mL). A negative result must be combined with clinical  observations, patient history, and epidemiological information. If result is POSITIVE SARS-CoV-2 target nucleic acids are DETECTED. The SARS-CoV-2 RNA is generally detectable in upper and lower  respiratory specimens dur ing the acute phase of infection.  Positive  results are indicative of active infection with SARS-CoV-2.  Clinical  correlation with patient history and other diagnostic information is  necessary to determine patient infection status.  Positive results do  not rule out bacterial infection or co-infection with other viruses. If result is PRESUMPTIVE POSTIVE SARS-CoV-2 nucleic acids MAY BE PRESENT.   A presumptive positive result was obtained on the submitted specimen  and confirmed on repeat testing.  While 2019 novel coronavirus  (SARS-CoV-2) nucleic acids may be present in the submitted sample  additional confirmatory testing may be necessary for epidemiological  and / or clinical management purposes  to differentiate between  SARS-CoV-2 and other Sarbecovirus currently known to  infect humans.  If clinically indicated additional testing with an alternate test  methodology [phone]) is advised. The SARS-CoV-2 RNA is generally  detectable in upper and lower respiratory sp ecimens during the acute  phase of infection. The expected result is Negative. Fact Sheet for Patients:  StrictlyIdeas.no Fact Sheet for Healthcare Providers: BankingDealers.co.za This test is not yet approved or cleared by the [place] FDA and has been authorized for detection and/or diagnosis of SARS-CoV-2  by FDA under an Emergency Use Authorization (EUA).  This EUA will remain in effect (meaning this test can be used) for the duration of the COVID-19 declaration under Section 564(b)(1) of the Act, 21 U.S.C. section 360bbb-3(b)(1), unless the authorization is terminated or revoked sooner. Performed at [facility], [place], [place] [address]    Mr 3d Recon At Scanner  Result Date: 02/25/2019 CLINICAL DATA:  53 year old female with history of epigastric and right upper quadrant abdominal pain with nausea. Cholelithiasis. EXAM: MRI ABDOMEN WITHOUT AND WITH CONTRAST (INCLUDING MRCP) TECHNIQUE: Multiplanar multisequence MR imaging of the abdomen was performed both before and after the administration of intravenous contrast. Heavily T2-weighted images of the biliary and pancreatic ducts were obtained, and three-dimensional MRCP images were rendered by post processing. CONTRAST:  10 mL of Gadavist. COMPARISON:  No prior abdominal MRI. Abdominal ultrasound 02/25/2019. FINDINGS: Lower chest: Unremarkable. Hepatobiliary: No discrete cystic or solid hepatic lesions. No intra or extrahepatic biliary ductal dilatation noted on MRCP images. Common bile duct measures 5 mm in the porta hepatis. No filling defects in the common bile duct to suggest choledocholithiasis. Numerous small filling defects lying dependently in the gallbladder,  compatible with cholelithiasis. Gallbladder is not distended. Gallbladder wall thickness is increased, predominantly related to gallbladder wall edema, measuring up to 12 mm. Trace volume of pericholecystic fluid. Increased enhancement of the gallbladder mucosa. Pancreas: No pancreatic mass. No pancreatic ductal dilatation noted on MRCP images. No pancreatic or peripancreatic fluid collections or inflammatory changes. Spleen:  Unremarkable. Adrenals/Urinary Tract: Bilateral kidneys and adrenal glands are normal in appearance. No hydroureteronephrosis in the visualized portions of the abdomen. Stomach/Bowel: Visualized portions are unremarkable. Vascular/Lymphatic: Aortic atherosclerosis, without evidence of aneurysm or dissection in the abdominal vasculature. No lymphadenopathy noted in the abdomen. Other: No significant volume of ascites noted in the visualized portions of the peritoneal cavity. Musculoskeletal: No aggressive appearing osseous lesions are noted in the visualized portions of the skeleton. IMPRESSION: 1. Cholelithiasis with evidence of gallbladder wall edema, mucosal hyperenhancement and trace volume of pericholecystic fluid. Although the gallbladder is not distended, these findings are concerning for very early acute cholecystitis. 2. No evidence of choledocholithiasis or biliary tract obstruction. 3. Aortic atherosclerosis. Electronically Signed   By: Vinnie Langton M.D.   On: 02/25/2019 06:45   Mr Abdomen Mrcp Moise Boring Contast  Result Date: 02/25/2019 CLINICAL DATA:  53 year old female with history of epigastric and right upper quadrant abdominal pain with nausea. Cholelithiasis. EXAM: MRI ABDOMEN WITHOUT AND WITH CONTRAST (INCLUDING MRCP) TECHNIQUE: Multiplanar multisequence MR imaging of the abdomen was performed both before and after the administration of intravenous contrast. Heavily T2-weighted images of the biliary and pancreatic ducts were obtained, and three-dimensional MRCP images were  rendered by post processing. CONTRAST:  10 mL of Gadavist. COMPARISON:  No prior abdominal MRI. Abdominal ultrasound 02/25/2019. FINDINGS: Lower chest: Unremarkable. Hepatobiliary: No discrete cystic or solid hepatic lesions. No intra or extrahepatic biliary ductal dilatation noted on MRCP images. Common bile duct measures 5 mm in the porta hepatis. No filling defects in the common bile duct to suggest choledocholithiasis. Numerous small filling defects lying dependently in the gallbladder, compatible with cholelithiasis. Gallbladder is not distended. Gallbladder wall thickness is increased, predominantly related to gallbladder wall edema, measuring up to 12 mm. Trace volume of pericholecystic fluid. Increased enhancement of the gallbladder mucosa. Pancreas: No pancreatic mass. No pancreatic ductal dilatation noted on MRCP images. No pancreatic or peripancreatic fluid collections or inflammatory changes. Spleen:  Unremarkable.  Adrenals/Urinary Tract: Bilateral kidneys and adrenal glands are normal in appearance. No hydroureteronephrosis in the visualized portions of the abdomen. Stomach/Bowel: Visualized portions are unremarkable. Vascular/Lymphatic: Aortic atherosclerosis, without evidence of aneurysm or dissection in the abdominal vasculature. No lymphadenopathy noted in the abdomen. Other: No significant volume of ascites noted in the visualized portions of the peritoneal cavity. Musculoskeletal: No aggressive appearing osseous lesions are noted in the visualized portions of the skeleton. IMPRESSION: 1. Cholelithiasis with evidence of gallbladder wall edema, mucosal hyperenhancement and trace volume of pericholecystic fluid. Although the gallbladder is not distended, these findings are concerning for very early acute cholecystitis. 2. No evidence of choledocholithiasis or biliary tract obstruction. 3. Aortic atherosclerosis. Electronically Signed   By: Vinnie Langton M.D.   On: 02/25/2019 06:45   US Abdomen  Limited Ruq  Result Date: 02/25/2019 CLINICAL DATA:  53 y/o F; epigastric and right upper quadrant pain with nausea. EXAM: ULTRASOUND ABDOMEN LIMITED RIGHT UPPER QUADRANT COMPARISON:  None. FINDINGS: Gallbladder: Multiple mobile echogenic gallstones measuring up to 10 mm. A nonmobile stone is present in the gallbladder neck. Borderline gallbladder wall measures 3.5 mm. Negative sonographic Murphy's sign. Common bile duct: Diameter: 3.9 mm Liver: No focal lesion. Diffusely increased liver echogenicity compatible with steatosis. Portal vein is patent on color Doppler imaging with normal direction of blood flow towards the liver. IMPRESSION: Mild gallbladder wall thickening and cholelithiasis including a nonmobile stone in the gallbladder neck. Findings may represent acute cholecystitis in the appropriate clinical setting. Hepatic steatosis. Electronically Signed   By: Kristine Garbe M.D.   On: 02/25/2019 02:47    Pending Labs Unresulted Labs (From admission, onward)    Start     Ordered   02/25/19 0755  HIV antibody (Routine Testing)  Once,   STAT     02/25/19 0756   02/25/19 0755  CBC  (heparin)  Once,   STAT    Comments:  Baseline for heparin therapy IF NOT ALREADY DRAWN.  Notify MD if PLT < 100 K.    02/25/19 0756   02/25/19 0755  Creatinine, serum  (heparin)  Once,   STAT    Comments:  Baseline for heparin therapy IF NOT ALREADY DRAWN.    02/25/19 0756   02/25/19 0747  Novel Coronavirus, NAA (hospital order; send-out to ref lab)  Once,   STAT    Comments:  No isolation needed for this testing (if isolation ordered for another indication, maintain current isolation).   Question:  Pre-procedural testing  Answer:  Yes   02/25/19 0746          Vitals/Pain Today's Vitals   02/25/19 0730 02/25/19 0800 02/25/19 0826 02/25/19 0830  BP: 124/77 128/79  131/80  Pulse: 63 (!) 57  (!) 58  Resp:    18  Temp:      TempSrc:      SpO2: 95% 95%  98%  Weight:      Height:      PainSc:    5      Isolation Precautions No active isolations  Medications Medications  piperacillin-tazobactam (ZOSYN) IVPB 3.375 g (3.375 g Intravenous Not Given 02/25/19 0838)  Chlorhexidine Gluconate Cloth 2 % PADS 6 each (has no administration in time range)    And  Chlorhexidine Gluconate Cloth 2 % PADS 6 each (has no administration in time range)  heparin injection 5,000 Units (has no administration in time range)  0.9 %  sodium chloride infusion (has no administration in time range)  piperacillin-tazobactam (ZOSYN)  IVPB 3.375 g (3.375 g Intravenous New Bag/Given 02/25/19 0832)  acetaminophen (TYLENOL) tablet 1,000 mg (has no administration in time range)  ketorolac (TORADOL) 30 MG/ML injection 30 mg (has no administration in time range)  HYDROmorphone (DILAUDID) injection 1 mg (1 mg Intravenous Given 02/25/19 0816)  ondansetron (ZOFRAN-ODT) disintegrating tablet 4 mg ( Oral See Alternative 02/25/19 3383)    Or  ondansetron (ZOFRAN) injection 4 mg (4 mg Intravenous Given 02/25/19 0823)  prochlorperazine (COMPAZINE) tablet 10 mg (has no administration in time range)    Or  prochlorperazine (COMPAZINE) injection 5-10 mg (has no administration in time range)  pantoprazole (PROTONIX) injection 40 mg (has no administration in time range)  hydrALAZINE (APRESOLINE) injection 10 mg (has no administration in time range)  HYDROmorphone (DILAUDID) injection 1 mg (1 mg Intravenous Given 02/25/19 0157)  ondansetron (ZOFRAN) injection 4 mg (4 mg Intravenous Given 02/25/19 0157)  gadobutrol (GADAVIST) 1 MMOL/ML injection 10 mL (10 mLs Intravenous Contrast Given 02/25/19 0612)  sodium chloride 0.9 % bolus 1,000 mL (1,000 mLs Intravenous New Bag/Given 02/25/19 0835)    Mobility walks Low fall risk   Focused Assessments    R Recommendations: See Admitting Provider Note  Report given to:   Additional Notes:  Consent signed and on chart, pt password [email or web address]

## 2019-02-25 NOTE — ED Notes (Signed)
Pt on phone speaking with MRI

## 2019-02-25 NOTE — Transfer of Care (Signed)
Immediate Anesthesia Transfer of Care Note  Patient: Rana Snare  Procedure(s) Performed: Procedure(s): LAPAROSCOPIC CHOLECYSTECTOMY (N/A)  Patient Location: PACU  Anesthesia Type:General  Level of Consciousness: sedated  Airway & Oxygen Therapy: Patient Spontanous Breathing and Patient connected to face mask oxygen  Post-op Assessment: Report given to RN and Post -op Vital signs reviewed and stable  Post vital signs: Reviewed and stable  Last Vitals:  Vitals:   02/25/19 0929 02/25/19 1735  BP: 133/84 (!) 147/72  Pulse: 68 62  Resp: 18 (!) 22  Temp: 37.1 C (!) 36.4 C  SpO2: 64% 383%    Complications: No apparent anesthesia complications

## 2019-02-25 NOTE — Anesthesia Post-op Follow-up Note (Signed)
Anesthesia QCDR form completed.        

## 2019-02-25 NOTE — ED Notes (Signed)
Pt reports last ate food around 1600 yesterday, pt last had liquids to drink around 2200 last night.  Pt states at this time the surgeon has not been in to discuss surgery.

## 2019-02-25 NOTE — Progress Notes (Signed)
Chaplain made two unsuccessful attempts to visit with pt before and after her procedure. Chaplain will follow up tomorrow.

## 2019-02-25 NOTE — ED Notes (Signed)
All metal removed from pt prior to MRI.

## 2019-02-25 NOTE — ED Notes (Signed)
Surgeon at bedside to discuss surgery with patient. Pt AO x4 signed consent. Patient Password: [email or web address]

## 2019-02-26 ENCOUNTER — Encounter: Payer: Self-pay | Admitting: Surgery

## 2019-02-26 MED ORDER — CODEINE SULFATE 60 MG PO TABS: 60.0000 mg | ORAL_TABLET | ORAL | 0 refills | Status: AC | PRN

## 2019-02-26 NOTE — Progress Notes (Signed)
   02/26/19 1400  Clinical Encounter Type  Visited With Patient not available  Visit Type Follow-up  Referral From Chaplain   OR received to complete or update an AD. Unfortunately, the patient was discharged prior to follow up.

## 2019-02-26 NOTE — Progress Notes (Signed)
Rana Snare to be D/C'd Home per MD order.  Discussed prescriptions and follow up appointments with the patient. Prescriptions given to patient, medication list explained in detail. Pt verbalized understanding.  Allergies as of 02/26/2019      Reactions   Gluten Meal Other (See Comments)   Celiac disease Other reaction(s): Other (See Comments) Celiac disease Other reaction(s): has celiac Celiac disease   Hydrocodone Itching   Oxycodone Itching   Tramadol Itching      Medication List    TAKE these medications   codeine 60 MG tablet Take 1 tablet (60 mg total) by mouth every 4 (four) hours as needed for up to 7 days for moderate pain.   multivitamin tablet Take 1 tablet by mouth daily.   pantoprazole 40 MG tablet Commonly known as:  PROTONIX Take 40 mg by mouth daily.       Vitals:   02/25/19 2030 02/26/19 0627  BP: 140/69 (!) 149/72  Pulse: (!) 55 (!) 56  Resp: 17 17  Temp: 97.9 F (36.6 C) 98.4 F (36.9 C)  SpO2: 99% 100%    Skin clean, dry and intact without evidence of skin break down, no evidence of skin tears noted. IV catheter discontinued intact. Site without signs and symptoms of complications. Dressing and pressure applied. Pt denies pain at this time. No complaints noted.  An After Visit Summary was printed and given to the patient. Patient escorted via [place], and D/C home via private auto.  Fuller Mandril, RN

## 2019-02-26 NOTE — Discharge Instructions (Signed)

## 2019-02-26 NOTE — Discharge Summary (Signed)
  Patient ID: Linda Bennett MRN: [id number] DOB/AGE: 1966/01/12 53 y.o.  Admit date: 02/25/2019 Discharge date: 02/26/2019   Discharge Diagnoses:  Active Problems:   Cholecystitis   Procedures: Lap  cholecystectomy  Hospital Course: 53 year old female presented with acute onset of abdominal pain consistent with acute cholecystitis and confirmed by ultrasound and MRCP.  Initially LFTs elevated but MRCP rule out choledocholithiasis.  She was taken appropriately to the operating room for laparoscopic cholecystectomy and was kept overnight.  At the time of discharge she was ambulating, tolerating regular diet and her pain was under control.  Her vital signs were stable.  Her physical exam showed a female in no acute distress.  Awake and alert.  Abdomen: Incisions healing well without evidence of infection.  No peritonitis.  Extremities: Well-perfused and no edema.  Condition of the patient at the time of discharge was stable.   Disposition: Discharge disposition: 01-Home or Self Care       Discharge Instructions    Call MD for:  difficulty breathing, headache or visual disturbances   Complete by:  As directed    Call MD for:  extreme fatigue   Complete by:  As directed    Call MD for:  hives   Complete by:  As directed    Call MD for:  persistant dizziness or light-headedness   Complete by:  As directed    Call MD for:  persistant nausea and vomiting   Complete by:  As directed    Call MD for:  redness, tenderness, or signs of infection (pain, swelling, redness, odor or green/yellow discharge around incision site)   Complete by:  As directed    Call MD for:  severe uncontrolled pain   Complete by:  As directed    Call MD for:  temperature >100.4   Complete by:  As directed    Diet - low sodium heart healthy   Complete by:  As directed    Discharge instructions   Complete by:  As directed    Shower tomorrow may apply ice packs   Increase activity slowly   Complete by:  As  directed    Lifting restrictions   Complete by:  As directed    20 lbs x 6 wks     Allergies as of 02/26/2019      Reactions   Gluten Meal Other (See Comments)   Celiac disease Other reaction(s): Other (See Comments) Celiac disease Other reaction(s): has celiac Celiac disease   Hydrocodone Itching   Oxycodone Itching   Tramadol Itching      Medication List    TAKE these medications   codeine 60 MG tablet Take 1 tablet (60 mg total) by mouth every 4 (four) hours as needed for up to 7 days for moderate pain.   multivitamin tablet Take 1 tablet by mouth daily.   pantoprazole 40 MG tablet Commonly known as:  PROTONIX Take 40 mg by mouth daily.      Follow-up Information    [place], [place] F, MD. Schedule an appointment as soon as possible for a visit in 2 week(s).   Specialty:  General Surgery Contact information: [address] [place] [address] [phone]            Caroleen Hamman, MD FACS

## 2019-02-27 LAB — HIV ANTIBODY (ROUTINE TESTING W REFLEX): HIV Screen 4th Generation wRfx: NONREACTIVE

## 2019-03-01 LAB — SURGICAL PATHOLOGY

## 2019-03-01 NOTE — Anesthesia Postprocedure Evaluation (Signed)
Anesthesia Post Note  Patient: Linda Bennett  Procedure(s) Performed: LAPAROSCOPIC CHOLECYSTECTOMY (N/A )  Patient location during evaluation: PACU Anesthesia Type: General Level of consciousness: awake and alert Pain management: pain level controlled Vital Signs Assessment: post-procedure vital signs reviewed and stable Respiratory status: spontaneous breathing, nonlabored ventilation, respiratory function stable and patient connected to nasal cannula oxygen Cardiovascular status: blood pressure returned to baseline and stable Postop Assessment: no apparent nausea or vomiting Anesthetic complications: no     Last Vitals:  Vitals:   02/25/19 2030 02/26/19 0627  BP: 140/69 (!) 149/72  Pulse: (!) 55 (!) 56  Resp: 17 17  Temp: 36.6 C 36.9 C  SpO2: 99% 100%    Last Pain:  Vitals:   02/26/19 1030  TempSrc:   PainSc: 0-No pain                 Molli Barrows

## 2019-03-13 ENCOUNTER — Ambulatory Visit (INDEPENDENT_AMBULATORY_CARE_PROVIDER_SITE_OTHER): Payer: Medicaid Other | Admitting: Surgery

## 2019-03-13 ENCOUNTER — Encounter: Payer: Self-pay | Admitting: Surgery

## 2019-03-13 ENCOUNTER — Other Ambulatory Visit: Payer: Self-pay

## 2019-03-13 VITALS — BP 110/72 | HR 68 | Temp 97.5°F | Resp 14 | Ht 66.0 in | Wt 228.0 lb

## 2019-03-13 DIAGNOSIS — Z09 Encounter for follow-up examination after completed treatment for conditions other than malignant neoplasm: Secondary | ICD-10-CM

## 2019-03-13 NOTE — Patient Instructions (Signed)
   Follow-up with our office as needed.  Please call and ask to speak with a nurse if you develop questions or concerns.  

## 2019-03-13 NOTE — Progress Notes (Signed)
S/p lap chole Path d/w pt in detail Taking PO No pain No fevers or chills  PE NAD Abd: soft, nt, incisions c/d/i  A/p Doing very well No heavy lifting RTC prn

## 2019-09-16 ENCOUNTER — Emergency Department
Admission: EM | Admit: 2019-09-16 | Discharge: 2019-09-16 | Disposition: A | Discharge status: Home / Self Care | Payer: Medicaid Other | Attending: Emergency Medicine | Admitting: Emergency Medicine

## 2019-09-16 ENCOUNTER — Other Ambulatory Visit: Payer: Self-pay

## 2019-09-16 DIAGNOSIS — Z79899 Other long term (current) drug therapy: Secondary | ICD-10-CM | POA: Insufficient documentation

## 2019-09-16 DIAGNOSIS — R1013 Epigastric pain: Secondary | ICD-10-CM | POA: Diagnosis not present

## 2019-09-16 DIAGNOSIS — R112 Nausea with vomiting, unspecified: Secondary | ICD-10-CM | POA: Diagnosis present

## 2019-09-16 DIAGNOSIS — F17210 Nicotine dependence, cigarettes, uncomplicated: Secondary | ICD-10-CM | POA: Insufficient documentation

## 2019-09-16 LAB — COMPREHENSIVE METABOLIC PANEL
ALT: 18 U/L (ref 0–44)
AST: 20 U/L (ref 15–41)
Albumin: 4 g/dL (ref 3.5–5.0)
Alkaline Phosphatase: 100 U/L (ref 38–126)
Anion gap: 12 (ref 5–15)
BUN: 13 mg/dL (ref 6–20)
CO2: 21 mmol/L — ABNORMAL LOW (ref 22–32)
Calcium: 9.4 mg/dL (ref 8.9–10.3)
Chloride: 106 mmol/L (ref 98–111)
Creatinine, Ser: 0.8 mg/dL (ref 0.44–1.00)
GFR calc Af Amer: >60 mL/min (ref >60)
GFR calc non Af Amer: >60 mL/min (ref >60)
Glucose, Bld: 119 mg/dL — ABNORMAL HIGH (ref 70–99)
Potassium: 3.7 mmol/L (ref 3.5–5.1)
Sodium: 139 mmol/L (ref 135–145)
Total Bilirubin: 1 mg/dL (ref 0.3–1.2)
Total Protein: 7.9 g/dL (ref 6.5–8.1)

## 2019-09-16 LAB — CBC
HCT: 40.7 % (ref 36.0–46.0)
Hemoglobin: 13.2 g/dL (ref 12.0–15.0)
MCH: 26.3 pg (ref 26.0–34.0)
MCHC: 32.4 g/dL (ref 30.0–36.0)
MCV: 81.2 fL (ref 80.0–100.0)
Platelets: 228 10*3/uL (ref 150–400)
RBC: 5.01 MIL/uL (ref 3.87–5.11)
RDW: 12.8 % (ref 11.5–15.5)
WBC: 16.1 10*3/uL — ABNORMAL HIGH (ref 4.0–10.5)
nRBC: 0 % (ref 0.0–0.2)

## 2019-09-16 LAB — URINALYSIS, COMPLETE (UACMP) WITH MICROSCOPIC
Bacteria, UA: NONE SEEN
Bilirubin Urine: NEGATIVE
Glucose, UA: NEGATIVE mg/dL
Hgb urine dipstick: NEGATIVE
Ketones, ur: NEGATIVE mg/dL
Leukocytes,Ua: NEGATIVE
Nitrite: NEGATIVE
Protein, ur: NEGATIVE mg/dL
Specific Gravity, Urine: 1.011 (ref 1.005–1.030)
pH: 7 (ref 5.0–8.0)

## 2019-09-16 LAB — TROPONIN I (HIGH SENSITIVITY): Troponin I (High Sensitivity): 5 ng/L (ref <18)

## 2019-09-16 LAB — LIPASE, BLOOD: Lipase: 26 U/L (ref 11–51)

## 2019-09-16 MED ORDER — ONDANSETRON HCL 4 MG/2ML IJ SOLN
INTRAMUSCULAR | Status: AC
  Administered 2019-09-16: 20:00:00 4 mg via INTRAVENOUS
  Filled 2019-09-16: qty 2

## 2019-09-16 MED ORDER — ONDANSETRON 4 MG PO TBDP: 4.0000 mg | ORAL_TABLET | Freq: Three times a day (TID) | ORAL | 0 refills | Status: DC | PRN

## 2019-09-16 MED ORDER — ONDANSETRON HCL 4 MG/2ML IJ SOLN: 4.0000 mg | Freq: Once | INTRAMUSCULAR | Status: AC

## 2019-09-16 NOTE — Discharge Instructions (Addendum)
Your work-up was reassuring and your abdominal exam was soft and nontender.  We discussed CT scan but opted to hold off given how well-appearing and you are asymptomatic at this time.  If you have return of your abdominal pain or vomiting you can return to the ER for reevaluation.  We will give you a few nausea medicine to help but again if your symptoms come back are severe you should return for reevaluation.

## 2019-09-16 NOTE — ED Triage Notes (Signed)
Patient to triage via wheelchair by EMS.  Per EMS patient with abdominal pain since this am.  Nothing makes better or worse, had similar episode in May and had gallbladder removed.  +nausea one episode after eating apple and drank some water.  EMS vital sign:  Temp 96.9 (forehead), HR 60's, BP 109.71, pulse oxi 98% on room air, CBG 146.  Patient with saline loc via 20g angiocath to left hand.

## 2019-09-16 NOTE — ED Provider Notes (Signed)
[facility] Emergency Department Provider Note  ____________________________________________   First MD Initiated Contact with Patient 09/16/19 2207     (approximate)  I have reviewed the triage vital signs and the nursing notes.   HISTORY  Chief Complaint Emesis    HPI Linda Bennett is a 53 y.o. female with celiac disease who comes in with abdominal pain.  Patient had similar episode in May and had a gallbladder removed.  During this time patient had an MRCP that ruled out choledocholithiasis and underwent cholecystectomy.  Patient states that her pain started this morning while at rest.  The pain was epigastric in nature, intermittent, nothing made it better, nothing made it worse.  It was associated with some nausea and vomiting.  That was nonbloody nonbilious.  Still having normal bowel movements.  Still passing gas.  She currently has not had any pain or vomiting for the past 1 hour.  She is feeling much better at this time.  She states that they did grow out yesterday and her daughter had some abdominal pain afterwards and diarrhea afterwards.   Past Medical History:  Diagnosis Date  . Celiac disease   . Hidradenitis suppurativa   . High cholesterol   . Pre-diabetes   . Psoriasis   . Psoriatic arthritis ([place])   . Vitamin D deficiency     Patient Active Problem List   Diagnosis Date Noted  . Cholecystitis 02/25/2019    Past Surgical History:  Procedure Laterality Date  . BACK SURGERY    . CHOLECYSTECTOMY N/A 02/25/2019   Procedure: LAPAROSCOPIC CHOLECYSTECTOMY;  Surgeon: Jules Husbands, MD;  Location: [facility] ORS;  Service: General;  Laterality: N/A;  . SHOULDER SURGERY    . TONSILLECTOMY    . TUBAL LIGATION      Prior to Admission medications   Medication Sig Start Date End Date Taking? Authorizing Provider  Multiple Vitamin (MULTIVITAMIN) tablet Take 1 tablet by mouth daily.    [provider]  pantoprazole (PROTONIX) 40 MG  tablet Take 40 mg by mouth daily.    [provider]  polyethylene glycol (MIRALAX / GLYCOLAX) 17 g packet DISSOLVE 1 PACKET IN LIQUID & [place] DAILY 02/24/19   [provider]    Allergies Gluten meal, Hydrocodone, Oxycodone, and Tramadol  No family history on file.  Social History Social History   Tobacco Use  . Smoking status: Current Every Day Smoker    Packs/day: 0.25    Types: Cigarettes  . Smokeless tobacco: Never Used  Substance Use Topics  . Alcohol use: No  . Drug use: No      Review of Systems Constitutional: No fever/chills Eyes: No visual changes. ENT: No sore throat. Cardiovascular: Denies chest pain. Respiratory: Denies shortness of breath. Gastrointestinal: Positive abdominal pain and vomiting Genitourinary: Negative for dysuria. Musculoskeletal: Negative for back pain. Skin: Negative for rash. Neurological: Negative for headaches, focal weakness or numbness. All other ROS negative ____________________________________________   PHYSICAL EXAM:  VITAL SIGNS: ED Triage Vitals  Enc Vitals Group     BP 09/16/19 2009 103/82     Pulse Rate 09/16/19 2009 63     Resp 09/16/19 2009 20     Temp 09/16/19 2009 98.2 F (36.8 C)     Temp Source 09/16/19 2009 Oral     SpO2 09/16/19 2009 100 %     Weight 09/16/19 2006 237 lb (107.5 kg)     Height 09/16/19 2006 5\' 6"  (1.676 m)  Head Circumference --      Peak Flow --      Pain Score 09/16/19 2005 5     Pain Loc --      Pain Edu? --      Excl. in [place]? --     Constitutional: Alert and oriented. Well appearing and in no acute distress. Eyes: Conjunctivae are normal. EOMI. Head: Atraumatic. Nose: No congestion/rhinnorhea. Mouth/Throat: Mucous membranes are moist.   Neck: No stridor. Trachea Midline. FROM Cardiovascular: Normal rate, regular rhythm. Grossly normal heart sounds.  Good peripheral circulation. Respiratory: Normal respiratory effort.  No retractions. Lungs  CTAB. Gastrointestinal: Soft and nontender. No distention. No abdominal bruits.  Musculoskeletal: No lower extremity tenderness nor edema.  No joint effusions. Neurologic:  Normal speech and language. No gross focal neurologic deficits are appreciated.  Skin:  Skin is warm, dry and intact. No rash noted. Psychiatric: Mood and affect are normal. Speech and behavior are normal. GU: Deferred   ____________________________________________   LABS (all labs ordered are listed, but only abnormal results are displayed)  Labs Reviewed  COMPREHENSIVE METABOLIC PANEL - Abnormal; Notable for the following components:      Result Value   CO2 21 (*)    Glucose, Bld 119 (*)    All other components within normal limits  CBC - Abnormal; Notable for the following components:   WBC 16.1 (*)    All other components within normal limits  URINALYSIS, COMPLETE (UACMP) WITH MICROSCOPIC - Abnormal; Notable for the following components:   Color, Urine YELLOW (*)    APPearance HAZY (*)    All other components within normal limits  LIPASE, BLOOD  TROPONIN I (HIGH SENSITIVITY)   ____________________________________________   ED ECG REPORT I, Vanessa [place], the attending physician, personally viewed and interpreted this ECG.  EKG is sinus rate of 55, no ST elevation, no T wave inversions, normal intervals ____________________________________________   PROCEDURES  Procedure(s) performed (including Critical Care):  Procedures   ____________________________________________   INITIAL IMPRESSION / ASSESSMENT AND PLAN / ED COURSE  Linda Bennett was evaluated in Emergency Department on 09/16/2019 for the symptoms described in the history of present illness. She was evaluated in the context of the global COVID-19 pandemic, which necessitated consideration that the patient might be at risk for infection with the SARS-CoV-2 virus that causes COVID-19. Institutional protocols and algorithms that pertain to  the evaluation of patients at risk for COVID-19 are in a state of rapid change based on information released by regulatory bodies including the CDC and federal and state organizations. These policies and algorithms were followed during the patient's care in the ED.    Patient is a 53 year old status post cholecystectomy who comes in with upper abdominal pain and vomiting that started this morning.  Currently her symptoms are resolved.  Labs were obtained while waiting for room to evaluate for electrolyte abnormalities, AKI, pancreatitis, retained stone.  Labs show normal electrolytes.  White count is elevated at 16.1.  Possibly reactive secondary to the vomiting.  Urine without evidence of UTI.  No LFT abnormality or lipase abnormality    I discussed patient's labs with her.  Patient's abdomen is completely soft and nontender she is completely asymptomatic at this time.  We discussed CT scan but if he has yet low utility given she is afebrile, well-appearing and her abdomen is reassuring.  At this time she like to hold off on CT scan.  Also LFTs are normal so lower suspicion for retained stone  at this time.  We discussed getting an EKG and cardiac markers to make sure is no signs of ACS since he said that some of the pain did go up into her chest.  We will do a p.o. challenge and if successful patient feels comfortable discharge home.  Possibly consider secondary to food poisoning given her daughter also felt sick yesterday after eating the same food.  Patient understands that if her symptoms return or she has worsening abdominal pain she should return to the ER for further evaluation.   Lance Bosch was negative.  EKG was normal sinus.  Reevaluated patient and she tolerated p.o.  Continues to be asymptomatic is comfortable with discharge home.  She will return if her symptoms return.  I discussed the provisional nature of ED diagnosis, the treatment so far, the ongoing plan of care, follow up appointments and  return precautions with the patient and any family or support people present. They expressed understanding and agreed with the plan, discharged home.  ____________________________________________   FINAL CLINICAL IMPRESSION(S) / ED DIAGNOSES   Final diagnoses:  Epigastric pain  Nausea and vomiting, intractability of vomiting not specified, unspecified vomiting type      MEDICATIONS GIVEN DURING THIS VISIT:  Medications  ondansetron (ZOFRAN) injection 4 mg (4 mg Intravenous Given 09/16/19 2016)     ED Discharge Orders         Ordered    ondansetron (ZOFRAN ODT) 4 MG disintegrating tablet  Every 8 hours PRN     09/16/19 2248           Note:  This document was prepared using Dragon voice recognition software and may include unintentional dictation errors.   Vanessa Bristol, MD 09/16/19 [phone]

## 2019-09-17 LAB — TROPONIN I (HIGH SENSITIVITY): Troponin I (High Sensitivity): 4 ng/L (ref <18)

## 2019-10-15 ENCOUNTER — Emergency Department
Admission: EM | Admit: 2019-10-15 | Discharge: 2019-10-15 | Disposition: A | Discharge status: Home / Self Care | Payer: Medicaid Other | Attending: Emergency Medicine | Admitting: Emergency Medicine

## 2019-10-15 ENCOUNTER — Other Ambulatory Visit: Payer: Self-pay

## 2019-10-15 DIAGNOSIS — R070 Pain in throat: Secondary | ICD-10-CM | POA: Diagnosis not present

## 2019-10-15 DIAGNOSIS — Z79899 Other long term (current) drug therapy: Secondary | ICD-10-CM | POA: Insufficient documentation

## 2019-10-15 DIAGNOSIS — T7840XA Allergy, unspecified, initial encounter: Secondary | ICD-10-CM | POA: Insufficient documentation

## 2019-10-15 DIAGNOSIS — L509 Urticaria, unspecified: Secondary | ICD-10-CM | POA: Diagnosis not present

## 2019-10-15 DIAGNOSIS — F17210 Nicotine dependence, cigarettes, uncomplicated: Secondary | ICD-10-CM | POA: Insufficient documentation

## 2019-10-15 DIAGNOSIS — L299 Pruritus, unspecified: Secondary | ICD-10-CM | POA: Diagnosis present

## 2019-10-15 LAB — CBC WITH DIFFERENTIAL/PLATELET
Abs Immature Granulocytes: 0.04 10*3/uL (ref 0.00–0.07)
Basophils Absolute: 0 10*3/uL (ref 0.0–0.1)
Basophils Relative: 0 %
Eosinophils Absolute: 0.3 10*3/uL (ref 0.0–0.5)
Eosinophils Relative: 2 %
HCT: 41.2 % (ref 36.0–46.0)
Hemoglobin: 12.6 g/dL (ref 12.0–15.0)
Immature Granulocytes: 0 %
Lymphocytes Relative: 49 %
Lymphs Abs: 5.5 10*3/uL — ABNORMAL HIGH (ref 0.7–4.0)
MCH: 26.5 pg (ref 26.0–34.0)
MCHC: 30.6 g/dL (ref 30.0–36.0)
MCV: 86.7 fL (ref 80.0–100.0)
Monocytes Absolute: 0.6 10*3/uL (ref 0.1–1.0)
Monocytes Relative: 5 %
Neutro Abs: 5.1 10*3/uL (ref 1.7–7.7)
Neutrophils Relative %: 44 %
Platelets: 141 10*3/uL — ABNORMAL LOW (ref 150–400)
RBC: 4.75 MIL/uL (ref 3.87–5.11)
RDW: 13.1 % (ref 11.5–15.5)
WBC: 11.5 10*3/uL — ABNORMAL HIGH (ref 4.0–10.5)
nRBC: 0 % (ref 0.0–0.2)

## 2019-10-15 MED ORDER — METHYLPREDNISOLONE SODIUM SUCC 125 MG IJ SOLR
INTRAMUSCULAR | Status: AC
  Administered 2019-10-15: 22:00:00 125 mg via INTRAVENOUS
  Filled 2019-10-15: qty 2

## 2019-10-15 MED ORDER — EPINEPHRINE 0.3 MG/0.3ML IJ SOAJ: 0.3000 mg | INTRAMUSCULAR | 0 refills | Status: AC | PRN

## 2019-10-15 MED ORDER — METHYLPREDNISOLONE SODIUM SUCC 125 MG IJ SOLR: 125.0000 mg | Freq: Once | INTRAMUSCULAR | Status: AC

## 2019-10-15 NOTE — ED Provider Notes (Signed)
[facility] Emergency Department Provider Note ____________________________________________   First MD Initiated Contact with Patient 10/15/19 2137     (approximate)  I have reviewed the triage vital signs and the nursing notes.   HISTORY  Chief Complaint Allergic Reaction    HPI Linda Bennett is a 54 y.o. female with PMH as noted below who presents after an apparent allergic reaction that occurred when she ate sesame chicken about an hour prior to coming to the ED.  She states that this is happened once before.  The reaction was characterized by itching, hives, and some throat discomfort and hoarseness.  The patient denies any shortness of breath.  She took a Benadryl, and then was given Benadryl and famotidine by EMS.  She states that the symptoms are improving.  Past Medical History:  Diagnosis Date  . Celiac disease   . Hidradenitis suppurativa   . High cholesterol   . Pre-diabetes   . Psoriasis   . Psoriatic arthritis ([place])   . Vitamin D deficiency     Patient Active Problem List   Diagnosis Date Noted  . Cholecystitis 02/25/2019    Past Surgical History:  Procedure Laterality Date  . BACK SURGERY    . CHOLECYSTECTOMY N/A 02/25/2019   Procedure: LAPAROSCOPIC CHOLECYSTECTOMY;  Surgeon: Jules Husbands, MD;  Location: [facility] ORS;  Service: General;  Laterality: N/A;  . SHOULDER SURGERY    . TONSILLECTOMY    . TUBAL LIGATION      Prior to Admission medications   Medication Sig Start Date End Date Taking? Authorizing Provider  EPINEPHrine (EPIPEN 2-PAK) 0.3 mg/0.3 mL IJ SOAJ injection Inject 0.3 mLs (0.3 mg total) into the muscle as needed for anaphylaxis. 10/15/19   Arta Silence, MD  Multiple Vitamin (MULTIVITAMIN) tablet Take 1 tablet by mouth daily.    [provider]  ondansetron (ZOFRAN ODT) 4 MG disintegrating tablet Take 1 tablet (4 mg total) by mouth every 8 (eight) hours as needed for nausea or vomiting. 09/16/19   Vanessa [place], MD  pantoprazole (PROTONIX) 40 MG tablet Take 40 mg by mouth daily.    [provider]  polyethylene glycol (MIRALAX / GLYCOLAX) 17 g packet DISSOLVE 1 PACKET IN LIQUID & [place] DAILY 02/24/19   [provider]    Allergies Gluten meal, Hydrocodone, Oxycodone, and Tramadol  No family history on file.  Social History Social History   Tobacco Use  . Smoking status: Current Every Day Smoker    Packs/day: 0.25    Types: Cigarettes  . Smokeless tobacco: Never Used  Substance Use Topics  . Alcohol use: No  . Drug use: No    Review of Systems  Constitutional: No fever/chills. Eyes: No visual changes. ENT: Positive for throat discomfort. Cardiovascular: Denies chest pain. Respiratory: Denies shortness of breath. Gastrointestinal: No vomiting or diarrhea.  Genitourinary: Negative for flank pain Musculoskeletal: Negative for back pain. Skin: Negative for rash. Neurological: Negative for headache.   ____________________________________________   PHYSICAL EXAM:  VITAL SIGNS: ED Triage Vitals  Enc Vitals Group     BP 10/15/19 2141 132/78     Pulse Rate 10/15/19 2141 67     Resp 10/15/19 2141 20     Temp 10/15/19 2141 97.9 F (36.6 C)     Temp Source 10/15/19 2141 Oral     SpO2 10/15/19 2141 100 %     Weight 10/15/19 2143 235 lb 14.3 oz (107 kg)     Height 10/15/19 2143  5\' 6"  (1.676 m)     Head Circumference --      Peak Flow --      Pain Score 10/15/19 2142 0     Pain Loc --      Pain Edu? --      Excl. in [place]? --     Constitutional: Alert and oriented. Well appearing and in no acute distress. Eyes: Conjunctivae are normal.  Head: Atraumatic. Nose: No congestion/rhinnorhea. Mouth/Throat: Mucous membranes are moist.  No oropharyngeal or uvular edema.  Hoarse voice.  No stridor.  No pooled secretions. Neck: Normal range of motion.  Cardiovascular: Normal rate, regular rhythm.  Good peripheral circulation. Respiratory: Normal  respiratory effort.  No retractions.  Gastrointestinal:  No distention.  Musculoskeletal:   Extremities warm and well perfused.  Neurologic:  Normal speech and language. No gross focal neurologic deficits are appreciated.  Skin:  Skin is warm and dry. No rash or hives noted. Psychiatric: Mood and affect are normal. Speech and behavior are normal.  ____________________________________________   LABS (all labs ordered are listed, but only abnormal results are displayed)  Labs Reviewed  CBC WITH DIFFERENTIAL/PLATELET - Abnormal; Notable for the following components:      Result Value   WBC 11.5 (*)    Platelets 141 (*)    Lymphs Abs 5.5 (*)    All other components within normal limits  BASIC METABOLIC PANEL   ____________________________________________  EKG  ED ECG REPORT I, Arta Silence, the attending physician, personally viewed and interpreted this ECG.  Date: 10/15/2019 EKG Time: 2141 Rate: 66 Rhythm: normal sinus rhythm QRS Axis: normal Intervals: normal ST/T Wave abnormalities: normal Narrative Interpretation: no evidence of acute ischemia  ____________________________________________  RADIOLOGY    ____________________________________________   PROCEDURES  Procedure(s) performed: No  Procedures  Critical Care performed: Yes  CRITICAL CARE Performed by: Arta Silence   Total critical care time: 30 minutes  Critical care time was exclusive of separately billable procedures and treating other patients.  Critical care was necessary to treat or prevent imminent or life-threatening deterioration.  Critical care was time spent personally by me on the following activities: development of treatment plan with patient and/or surrogate as well as nursing, discussions with consultants, evaluation of patient's response to treatment, examination of patient, obtaining history from patient or surrogate, ordering and performing treatments and  interventions, ordering and review of laboratory studies, ordering and review of radiographic studies, pulse oximetry and re-evaluation of patient's condition. ____________________________________________   INITIAL IMPRESSION / ASSESSMENT AND PLAN / ED COURSE  Pertinent labs & imaging results that were available during my care of the patient were reviewed by me and considered in my medical decision making (see chart for details).  54 year old female presents with an apparent allergic reaction after eating sesame chicken, with hives, redness, itching, and a hoarse voice.  The patient denies shortness of breath.  She received Benadryl and famotidine prior to arrival.  On exam currently, the patient is comfortable appearing.  Her O2 saturation is 100% on room air and she has no increased work of breathing or respiratory distress.  The oropharynx is clear.  She has no hives or rash.  Presentation is concerning with an allergic reaction.  Given that the patient's symptoms are already improving, there is no indication for epinephrine at this time.  We will give Solu-Medrol and observe the patient for a few hours.  If her symptoms continue to resolve I anticipate discharge home although I will prescribe her an  EpiPen.  ----------------------------------------- 11:22 PM on 10/15/2019 -----------------------------------------  The patient has now been in the ED for almost 2 hours.  Her symptoms are continuing to improve.  Her voice has become more clear.  She continues to have no oropharyngeal swelling, and has no shortness of breath.  There has been no recurrence of the hives.  She is stable for discharge home at this time.  I prescribed an EpiPen.  Return precautions given, and she expresses understanding. ____________________________________________   FINAL CLINICAL IMPRESSION(S) / ED DIAGNOSES  Final diagnoses:  Allergic reaction, initial encounter      NEW MEDICATIONS STARTED DURING THIS  VISIT:  New Prescriptions   EPINEPHRINE (EPIPEN 2-PAK) 0.3 MG/0.3 ML IJ SOAJ INJECTION    Inject 0.3 mLs (0.3 mg total) into the muscle as needed for anaphylaxis.     Note:  This document was prepared using Dragon voice recognition software and may include unintentional dictation errors.    Arta Silence, MD 10/15/19 2322

## 2019-10-15 NOTE — ED Triage Notes (Signed)
Pt arrives via [facility] w cc of allergic reaction after eating sesame chicken 1 hour ago. The same thing happened 8 years ago. Pt is hoarse, raspy voice, w itching, redness and hives.   Pt took oral benadryl prior to EMS arrival. They gave 50 benadryl IV and famotidine.  20 G L hand BP 128/66 NSR for EMS w HR 68

## 2019-10-15 NOTE — Discharge Instructions (Addendum)
Return to the ER immediately for new, worsening, or recurrent throat discomfort, shortness of breath, hives, or any other new or worsening symptoms that concern you.

## 2020-08-12 ENCOUNTER — Emergency Department: Payer: Medicaid Other

## 2020-08-12 ENCOUNTER — Emergency Department
Admission: EM | Admit: 2020-08-12 | Discharge: 2020-08-12 | Disposition: A | Discharge status: Home / Self Care | Payer: Medicaid Other | Attending: Student in an Organized Health Care Education/Training Program | Admitting: Student in an Organized Health Care Education/Training Program

## 2020-08-12 ENCOUNTER — Encounter: Payer: Self-pay | Admitting: Emergency Medicine

## 2020-08-12 ENCOUNTER — Other Ambulatory Visit: Payer: Self-pay

## 2020-08-12 DIAGNOSIS — R0602 Shortness of breath: Secondary | ICD-10-CM | POA: Diagnosis not present

## 2020-08-12 DIAGNOSIS — R0600 Dyspnea, unspecified: Secondary | ICD-10-CM

## 2020-08-12 DIAGNOSIS — F17210 Nicotine dependence, cigarettes, uncomplicated: Secondary | ICD-10-CM | POA: Diagnosis not present

## 2020-08-12 DIAGNOSIS — R1011 Right upper quadrant pain: Secondary | ICD-10-CM | POA: Diagnosis present

## 2020-08-12 LAB — BASIC METABOLIC PANEL
Anion gap: 8 (ref 5–15)
BUN: 13 mg/dL (ref 6–20)
CO2: 27 mmol/L (ref 22–32)
Calcium: 9.1 mg/dL (ref 8.9–10.3)
Chloride: 105 mmol/L (ref 98–111)
Creatinine, Ser: 0.81 mg/dL (ref 0.44–1.00)
GFR, Estimated: >60 mL/min (ref >60)
Glucose, Bld: 91 mg/dL (ref 70–99)
Potassium: 3.6 mmol/L (ref 3.5–5.1)
Sodium: 140 mmol/L (ref 135–145)

## 2020-08-12 LAB — CBC
HCT: 39 % (ref 36.0–46.0)
Hemoglobin: 12.3 g/dL (ref 12.0–15.0)
MCH: 27 pg (ref 26.0–34.0)
MCHC: 31.5 g/dL (ref 30.0–36.0)
MCV: 85.5 fL (ref 80.0–100.0)
Platelets: 231 10*3/uL (ref 150–400)
RBC: 4.56 MIL/uL (ref 3.87–5.11)
RDW: 12.7 % (ref 11.5–15.5)
WBC: 12.2 10*3/uL — ABNORMAL HIGH (ref 4.0–10.5)
nRBC: 0 % (ref 0.0–0.2)

## 2020-08-12 LAB — TROPONIN I (HIGH SENSITIVITY): Troponin I (High Sensitivity): 5 ng/L (ref <18)

## 2020-08-12 MED ORDER — IOHEXOL 350 MG/ML SOLN
100.0000 mL | Freq: Once | INTRAVENOUS | Status: AC | PRN
  Administered 2020-08-12: 100 mL via INTRAVENOUS

## 2020-08-12 MED ORDER — IOHEXOL 350 MG/ML SOLN
75.0000 mL | Freq: Once | INTRAVENOUS | Status: AC | PRN
  Administered 2020-08-12: 75 mL via INTRAVENOUS

## 2020-08-12 MED ORDER — OXYCODONE-ACETAMINOPHEN 5-325 MG PO TABS
1.0000 | ORAL_TABLET | ORAL | 0 refills | Status: DC | PRN
Start: 2020-08-12 — End: 2021-03-24

## 2020-08-12 NOTE — ED Provider Notes (Signed)
[facility] Emergency Department Provider Note    First MD Initiated Contact with Patient 08/12/20 1846     (approximate)  I have reviewed the triage vital signs and the nursing notes.   HISTORY  Chief Complaint Abdominal Pain and Shortness of Breath    HPI Linda Bennett is a 54 y.o. female the below listed past medical history with recent Covid illness presents to the ER 1 week post right upper quadrant ultrasound biopsy with persistent worsening pain as well as pleuritic discomfort shortness of breath and a exertional dyspnea.  No measured fevers.  She is on any blood thinners.  She denies any residual chest.  Still the right side does hurt with movement and deep inspiration has been going on daily for 7 days.    Past Medical History:  Diagnosis Date  . Celiac disease   . Hidradenitis suppurativa   . High cholesterol   . Pre-diabetes   . Psoriasis   . Psoriatic arthritis ([place])   . Vitamin D deficiency    No family history on file. Past Surgical History:  Procedure Laterality Date  . BACK SURGERY    . CHOLECYSTECTOMY N/A 02/25/2019   Procedure: LAPAROSCOPIC CHOLECYSTECTOMY;  Surgeon: Jules Husbands, MD;  Location: [facility] ORS;  Service: General;  Laterality: N/A;  . LIVER BIOPSY    . SHOULDER SURGERY    . TONSILLECTOMY    . TUBAL LIGATION     Patient Active Problem List   Diagnosis Date Noted  . Cholecystitis 02/25/2019      Prior to Admission medications   Medication Sig Start Date End Date Taking? Authorizing Provider  EPINEPHrine (EPIPEN 2-PAK) 0.3 mg/0.3 mL IJ SOAJ injection Inject 0.3 mLs (0.3 mg total) into the muscle as needed for anaphylaxis. 10/15/19   Arta Silence, MD  Multiple Vitamin (MULTIVITAMIN) tablet Take 1 tablet by mouth daily.    [provider]  ondansetron (ZOFRAN ODT) 4 MG disintegrating tablet Take 1 tablet (4 mg total) by mouth every 8 (eight) hours as needed for nausea or vomiting. 09/16/19   Vanessa Homer, MD  oxyCODONE-acetaminophen (PERCOCET) 5-325 MG tablet Take 1 tablet by mouth every 4 (four) hours as needed for severe pain. 08/12/20 08/12/21  Merlyn Lot, MD  pantoprazole (PROTONIX) 40 MG tablet Take 40 mg by mouth daily.    [provider]  polyethylene glycol (MIRALAX / GLYCOLAX) 17 g packet DISSOLVE 1 PACKET IN LIQUID & [place] DAILY 02/24/19   [provider]    Allergies Gluten meal, Hydrocodone, Oxycodone, and Tramadol    Social History Social History   Tobacco Use  . Smoking status: Current Every Day Smoker    Packs/day: 0.25    Types: Cigarettes  . Smokeless tobacco: Never Used  Substance Use Topics  . Alcohol use: No  . Drug use: No    Review of Systems Patient denies headaches, rhinorrhea, blurry vision, numbness, shortness of breath, chest pain, edema, cough, abdominal pain, nausea, vomiting, diarrhea, dysuria, fevers, rashes or hallucinations unless otherwise stated above in HPI. ____________________________________________   PHYSICAL EXAM:  VITAL SIGNS: Vitals:   08/12/20 1845 08/12/20 1900  BP: (!) 158/82 (!) 145/66  Pulse: 66 62  Resp:  (!) 28  Temp:    SpO2: 98% 96%    Constitutional: Alert and oriented.  Eyes: Conjunctivae are normal.  Head: Atraumatic. Nose: No congestion/rhinnorhea. Mouth/Throat: Mucous membranes are moist.   Neck: No stridor. Painless ROM.  Cardiovascular: Normal rate, regular rhythm.  Grossly normal heart sounds.  Good peripheral circulation. Respiratory: Normal respiratory effort.  No retractions. Lungs CTAB. Gastrointestinal: Soft with mild ruq ttp. No distention. No abdominal bruits. No CVA tenderness. Genitourinary:  Musculoskeletal: No lower extremity tenderness nor edema.  No joint effusions. Neurologic:  Normal speech and language. No gross focal neurologic deficits are appreciated. No facial droop Skin:  Skin is warm, dry and intact. No rash noted. Psychiatric: Mood and affect are  normal. Speech and behavior are normal.  ____________________________________________   LABS (all labs ordered are listed, but only abnormal results are displayed)  Results for orders placed or performed during the hospital encounter of 08/12/20 (from the past 24 hour(s))  Basic metabolic panel     Status: None   Collection Time: 08/12/20  1:38 PM  Result Value Ref Range   Sodium 140 135 - 145 mmol/L   Potassium 3.6 3.5 - 5.1 mmol/L   Chloride 105 98 - 111 mmol/L   CO2 27 22 - 32 mmol/L   Glucose, Bld 91 70 - 99 mg/dL   BUN 13 6 - 20 mg/dL   Creatinine, Ser 0.81 0.44 - 1.00 mg/dL   Calcium 9.1 8.9 - 10.3 mg/dL   GFR, Estimated >60 >60 mL/min   Anion gap 8 5 - 15  CBC     Status: Abnormal   Collection Time: 08/12/20  1:38 PM  Result Value Ref Range   WBC 12.2 (H) 4.0 - 10.5 K/uL   RBC 4.56 3.87 - 5.11 MIL/uL   Hemoglobin 12.3 12.0 - 15.0 g/dL   HCT 39.0 36 - 46 %   MCV 85.5 80.0 - 100.0 fL   MCH 27.0 26.0 - 34.0 pg   MCHC 31.5 30.0 - 36.0 g/dL   RDW 12.7 11.5 - 15.5 %   Platelets 231 150 - 400 K/uL   nRBC 0.0 0.0 - 0.2 %  Troponin I (High Sensitivity)     Status: None   Collection Time: 08/12/20  1:38 PM  Result Value Ref Range   Troponin I (High Sensitivity) 5 <18 ng/L  Type and screen [facility]     Status: None (Preliminary result)   Collection Time: 08/12/20  2:09 PM  Result Value Ref Range   ABO/RH(D) PENDING    Antibody Screen PENDING    Sample Expiration      08/15/2020,2359 Performed at [facility], [address] [place], [place] [address]    ____________________________________________  EKG My review and personal interpretation at Time: 13:34   Indication: chest pain  Rate: 60  Rhythm: sinus Axis: normal Other: normal intervals, no stemi ____________________________________________  RADIOLOGY  I personally reviewed all radiographic images ordered to evaluate for the above acute complaints and reviewed radiology  reports and findings.  These findings were personally discussed with the patient.  Please see medical record for radiology report.  ____________________________________________   PROCEDURES  Procedure(s) performed:  Procedures    Critical Care performed: no ____________________________________________   INITIAL IMPRESSION / ASSESSMENT AND PLAN / ED COURSE  Pertinent labs & imaging results that were available during my care of the patient were reviewed by me and considered in my medical decision making (see chart for details).   DDX: Pleurisy, hepatitis, anemia, pneumonia, post Covid syndrome, PE, pneumothorax, ACS  Linda Bennett is a 54 y.o. who presents to the ED with presentation as described above.  She is nontoxic-appearing but is dyspneic.  Presentation complicated by recent Covid illness and recent biopsy.  Does  have mild leukocytosis.  Chest x-ray without evidence of pneumothorax.  Given her symptoms I am going to order CTA to evaluate for PE given her shortness of breath being the primary symptom.  Will also order right upper quadrant ultrasound to evaluate for any evidence of hepatic abnormality.  Clinical Course as of Aug 12 2108  Mon Aug 12, 2020  2013 No PE.  Discussed case with radiology who does recommend right upper quadrant ultrasound to further evaluate liver.   [PR]  2100 Patient reassessed.  Not hemodynamically unstable but is having worsening pain. Over several days.  Hgb is stable.  Based on her pain, I will send for dedicated ct liver w/wo per discussion with radiology to better characterize and rule out signs of active extravasation or hepatic injury.  Pain may be 2/2 post biopsy hematoma causing diaphragmatic irriation.  As her hgb is stable, as long as no sign of bleeding, would be appropriate for outpatient follow up.  Patient agreeable to plan.   [PR]    Clinical Course User Index [PR] Merlyn Lot, MD    The patient was evaluated in Emergency  Department today for the symptoms described in the history of present illness. He/she was evaluated in the context of the global COVID-19 pandemic, which necessitated consideration that the patient might be at risk for infection with the SARS-CoV-2 virus that causes COVID-19. Institutional protocols and algorithms that pertain to the evaluation of patients at risk for COVID-19 are in a state of rapid change based on information released by regulatory bodies including the CDC and federal and state organizations. These policies and algorithms were followed during the patient's care in the ED.  As part of my medical decision making, I reviewed the following data within the [place] notes reviewed and incorporated, Labs reviewed, notes from prior ED visits and [place] Controlled Substance Database   ____________________________________________   FINAL CLINICAL IMPRESSION(S) / ED DIAGNOSES  Final diagnoses:  RUQ abdominal pain  Dyspnea, unspecified type      NEW MEDICATIONS STARTED DURING THIS VISIT:  New Prescriptions   OXYCODONE-ACETAMINOPHEN (PERCOCET) 5-325 MG TABLET    Take 1 tablet by mouth every 4 (four) hours as needed for severe pain.     Note:  This document was prepared using Dragon voice recognition software and may include unintentional dictation errors.    Merlyn Lot, MD 08/12/20 2109

## 2020-08-12 NOTE — ED Notes (Signed)
ED Provider at bedside. 

## 2020-08-12 NOTE — ED Provider Notes (Signed)
-----------------------------------------   9:04 PM on 08/12/2020 -----------------------------------------  Blood pressure (!) 145/66, pulse 62, temperature 98.2 F (36.8 C), temperature source Oral, resp. rate (!) 28, height 5\' 7"  (1.702 m), weight 106.1 kg, last menstrual period 03/05/2016, SpO2 96 %.  Assuming care from Dr. Quentin Cornwall.  In short, Linda Bennett is a 54 y.o. female with a chief complaint of Abdominal Pain and Shortness of Breath .  Refer to the original H&P for additional details.  The current plan of care is to follow-up CT imaging to ensure no active extravasation associated with small post-op hematoma.  ----------------------------------------- 10:47 PM on 08/12/2020 -----------------------------------------  CT liver results reviewed and appear most consistent with expected postop changes following patient's liver biopsy with small associated hematoma.  No evidence of active extravasation and patient appropriate for discharge home with outpatient follow-up per Dr. Quentin Cornwall.  Patient with minimal pain at this time, agrees with plan for discharge home and also agrees to return to the ED for any new or worsening symptoms.    Blake Divine, MD 08/12/20 2248

## 2020-08-12 NOTE — ED Triage Notes (Signed)
Pt reports had a liver biopsy done last Monday and has had pain to her RUQ since then. Pt reports is also experiencing some SOB. Pt reports called her MD and was advised to come to the ED because she should not still have pain or be short of breath

## 2020-08-12 NOTE — ED Notes (Signed)
Pt calm , collective , denied pain or sob  

## 2020-08-12 NOTE — Discharge Instructions (Signed)

## 2020-11-27 ENCOUNTER — Other Ambulatory Visit: Payer: Self-pay | Admitting: Family Medicine

## 2020-11-27 DIAGNOSIS — Z1231 Encounter for screening mammogram for malignant neoplasm of breast: Secondary | ICD-10-CM

## 2021-03-24 ENCOUNTER — Emergency Department: Payer: Medicaid Other

## 2021-03-24 ENCOUNTER — Other Ambulatory Visit: Payer: Self-pay

## 2021-03-24 ENCOUNTER — Emergency Department
Admission: EM | Admit: 2021-03-24 | Discharge: 2021-03-24 | Disposition: A | Discharge status: Home / Self Care | Payer: Medicaid Other | Attending: Emergency Medicine | Admitting: Emergency Medicine

## 2021-03-24 DIAGNOSIS — R079 Chest pain, unspecified: Secondary | ICD-10-CM | POA: Diagnosis not present

## 2021-03-24 DIAGNOSIS — R7401 Elevation of levels of liver transaminase levels: Secondary | ICD-10-CM | POA: Diagnosis not present

## 2021-03-24 DIAGNOSIS — R748 Abnormal levels of other serum enzymes: Secondary | ICD-10-CM

## 2021-03-24 DIAGNOSIS — R059 Cough, unspecified: Secondary | ICD-10-CM

## 2021-03-24 DIAGNOSIS — R0981 Nasal congestion: Secondary | ICD-10-CM | POA: Diagnosis not present

## 2021-03-24 DIAGNOSIS — J029 Acute pharyngitis, unspecified: Secondary | ICD-10-CM | POA: Insufficient documentation

## 2021-03-24 DIAGNOSIS — R110 Nausea: Secondary | ICD-10-CM | POA: Diagnosis not present

## 2021-03-24 DIAGNOSIS — R0602 Shortness of breath: Secondary | ICD-10-CM | POA: Diagnosis not present

## 2021-03-24 DIAGNOSIS — R519 Headache, unspecified: Secondary | ICD-10-CM | POA: Diagnosis not present

## 2021-03-24 DIAGNOSIS — F17210 Nicotine dependence, cigarettes, uncomplicated: Secondary | ICD-10-CM | POA: Insufficient documentation

## 2021-03-24 DIAGNOSIS — Z20822 Contact with and (suspected) exposure to covid-19: Secondary | ICD-10-CM | POA: Diagnosis not present

## 2021-03-24 LAB — CBC WITH DIFFERENTIAL/PLATELET
Abs Immature Granulocytes: 0.05 10*3/uL (ref 0.00–0.07)
Basophils Absolute: 0.1 10*3/uL (ref 0.0–0.1)
Basophils Relative: 1 %
Eosinophils Absolute: 0.4 10*3/uL (ref 0.0–0.5)
Eosinophils Relative: 3 %
HCT: 38.1 % (ref 36.0–46.0)
Hemoglobin: 12 g/dL (ref 12.0–15.0)
Immature Granulocytes: 0 %
Lymphocytes Relative: 34 %
Lymphs Abs: 4.4 10*3/uL — ABNORMAL HIGH (ref 0.7–4.0)
MCH: 26.4 pg (ref 26.0–34.0)
MCHC: 31.5 g/dL (ref 30.0–36.0)
MCV: 83.9 fL (ref 80.0–100.0)
Monocytes Absolute: 0.7 10*3/uL (ref 0.1–1.0)
Monocytes Relative: 5 %
Neutro Abs: 7.2 10*3/uL (ref 1.7–7.7)
Neutrophils Relative %: 57 %
Platelets: 325 10*3/uL (ref 150–400)
RBC: 4.54 MIL/uL (ref 3.87–5.11)
RDW: 12.6 % (ref 11.5–15.5)
WBC: 12.8 10*3/uL — ABNORMAL HIGH (ref 4.0–10.5)
nRBC: 0 % (ref 0.0–0.2)

## 2021-03-24 LAB — RESP PANEL BY RT-PCR (FLU A&B, COVID) ARPGX2
Influenza A by PCR: NEGATIVE
Influenza B by PCR: NEGATIVE
SARS Coronavirus 2 by RT PCR: NEGATIVE

## 2021-03-24 LAB — COMPREHENSIVE METABOLIC PANEL
ALT: 123 U/L — ABNORMAL HIGH (ref 0–44)
AST: 77 U/L — ABNORMAL HIGH (ref 15–41)
Albumin: 3.7 g/dL (ref 3.5–5.0)
Alkaline Phosphatase: 142 U/L — ABNORMAL HIGH (ref 38–126)
Anion gap: 7 (ref 5–15)
BUN: 8 mg/dL (ref 6–20)
CO2: 26 mmol/L (ref 22–32)
Calcium: 8.9 mg/dL (ref 8.9–10.3)
Chloride: 105 mmol/L (ref 98–111)
Creatinine, Ser: 0.76 mg/dL (ref 0.44–1.00)
GFR, Estimated: >60 mL/min (ref >60)
Glucose, Bld: 128 mg/dL — ABNORMAL HIGH (ref 70–99)
Potassium: 3.9 mmol/L (ref 3.5–5.1)
Sodium: 138 mmol/L (ref 135–145)
Total Bilirubin: 1.2 mg/dL (ref 0.3–1.2)
Total Protein: 7.4 g/dL (ref 6.5–8.1)

## 2021-03-24 LAB — D-DIMER, QUANTITATIVE: D-Dimer, Quant: 0.47 ug/mL-FEU (ref 0.00–0.50)

## 2021-03-24 LAB — TROPONIN I (HIGH SENSITIVITY): Troponin I (High Sensitivity): 5 ng/L (ref <18)

## 2021-03-24 LAB — BRAIN NATRIURETIC PEPTIDE: B Natriuretic Peptide: 143.8 pg/mL — ABNORMAL HIGH (ref 0.0–100.0)

## 2021-03-24 MED ORDER — PREDNISONE 10 MG PO TABS: ORAL_TABLET | ORAL | 0 refills | Status: AC

## 2021-03-24 MED ORDER — IPRATROPIUM-ALBUTEROL 0.5-2.5 (3) MG/3ML IN SOLN
3.0000 mL | Freq: Once | RESPIRATORY_TRACT | Status: AC
  Administered 2021-03-24: 3 mL via RESPIRATORY_TRACT
  Filled 2021-03-24: qty 3

## 2021-03-24 MED ORDER — ERYTHROMYCIN 5 MG/GM OP OINT: 1.0000 "application " | TOPICAL_OINTMENT | Freq: Four times a day (QID) | OPHTHALMIC | 0 refills | Status: AC

## 2021-03-24 MED ORDER — AZITHROMYCIN 500 MG PO TABS
500.0000 mg | ORAL_TABLET | Freq: Once | ORAL | Status: AC
  Administered 2021-03-24: 500 mg via ORAL
  Filled 2021-03-24: qty 1

## 2021-03-24 MED ORDER — BENZONATATE 100 MG PO CAPS
100.0000 mg | ORAL_CAPSULE | Freq: Once | ORAL | Status: AC
  Administered 2021-03-24: 100 mg via ORAL
  Filled 2021-03-24: qty 1

## 2021-03-24 MED ORDER — BENZONATATE 100 MG PO CAPS: 100.0000 mg | ORAL_CAPSULE | Freq: Three times a day (TID) | ORAL | 0 refills | Status: AC | PRN

## 2021-03-24 MED ORDER — PREDNISONE 20 MG PO TABS
60.0000 mg | ORAL_TABLET | Freq: Once | ORAL | Status: AC
  Administered 2021-03-24: 60 mg via ORAL
  Filled 2021-03-24: qty 3

## 2021-03-24 MED ORDER — AZITHROMYCIN 250 MG PO TABS: ORAL_TABLET | ORAL | 0 refills | Status: AC

## 2021-03-24 NOTE — Discharge Instructions (Addendum)
Please take steroids and antibiotics as prescribed. Return to the ER if you develop any worsening of symptoms. Please follow up with your primary care regarding your elevated BNP and liver enzymes.

## 2021-03-24 NOTE — ED Triage Notes (Signed)
Cough, congestion, sore throat, headache for one week.  Coughing up green mucous

## 2021-03-24 NOTE — ED Notes (Signed)
See triage note  Presents with sore throat fever at home and prod cough which started last Monday  Afebrile on arrival

## 2021-03-24 NOTE — ED Provider Notes (Signed)
[facility] Emergency Department Provider Note  ____________________________________________   Event Date/Time   First MD Initiated Contact with Patient 03/24/21 1231     (approximate)  I have reviewed the triage vital signs and the nursing notes.   HISTORY  Chief Complaint Cough, Shortness of Breath, and Sore Throat  HPI Linda Bennett is a 55 y.o. female who reports to the emergency department for evaluation of cough, congestion, sore throat, headache, shortness of breath and chest pain.  Patient states that she first became ill 1 week ago, with cough and headache.  She states that over the last few days, she has developed shortness of breath, persistent coughing with green discharge and chest pain upon inspiration.  She denies any cardiac history, denies any history of blood clots.  She denies any known fevers at home.  She does note that her shortness of breath is worse with lying down at night, feels that she cannot lay down at all due to shortness of breath.  She also reports shortness of breath is worse with exertion in her home.  She denies any recent known sick contacts, was tested for COVID this past Thursday and negative.  Of note she also notes redness to the left eye with drainage that began this morning.  She denies any persistent blurred vision, diplopia.  She does report nausea, denies any abdominal pain, vomiting or constipation or diarrhea.         Past Medical History:  Diagnosis Date   Celiac disease    Hidradenitis suppurativa    High cholesterol    Pre-diabetes    Psoriasis    Psoriatic arthritis ([place])    Vitamin D deficiency     Patient Active Problem List   Diagnosis Date Noted   Cholecystitis 02/25/2019    Past Surgical History:  Procedure Laterality Date   BACK SURGERY     CHOLECYSTECTOMY N/A 02/25/2019   Procedure: LAPAROSCOPIC CHOLECYSTECTOMY;  Surgeon: Jules Husbands, MD;  Location: [facility] ORS;  Service: General;   Laterality: N/A;   LIVER BIOPSY     SHOULDER SURGERY     TONSILLECTOMY     TUBAL LIGATION      Prior to Admission medications   Medication Sig Start Date End Date Taking? Authorizing Provider  azithromycin (ZITHROMAX Z-PAK) 250 MG tablet Take 2 tablets (500 mg) on  Day 1,  followed by 1 tablet (250 mg) once daily on Days 2 through 5. 03/24/21 03/29/21 Yes Dalayah Deahl, Farrel Gordon, PA  benzonatate (TESSALON PERLES) 100 MG capsule Take 1 capsule (100 mg total) by mouth 3 (three) times daily as needed for cough. 03/24/21 03/24/22 Yes Marlana Salvage, PA  erythromycin ophthalmic ointment Place 1 application into the left eye 4 (four) times daily. 03/24/21  Yes Marlana Salvage, PA  predniSONE (DELTASONE) 10 MG tablet Take 6 tablets (60 mg total) by mouth daily for 1 day, THEN 5 tablets (50 mg total) daily for 1 day, THEN 4 tablets (40 mg total) daily for 1 day, THEN 3 tablets (30 mg total) daily for 1 day, THEN 2 tablets (20 mg total) daily for 1 day, THEN 1 tablet (10 mg total) daily for 1 day. 03/24/21 03/30/21 Yes Lakitha Gordy, Farrel Gordon, PA  EPINEPHrine (EPIPEN 2-PAK) 0.3 mg/0.3 mL IJ SOAJ injection Inject 0.3 mLs (0.3 mg total) into the muscle as needed for anaphylaxis. 10/15/19   Arta Silence, MD  Multiple Vitamin (MULTIVITAMIN) tablet Take 1 tablet by mouth daily.    [provider]  pantoprazole (PROTONIX) 40 MG tablet Take 40 mg by mouth daily.    [provider]  polyethylene glycol (MIRALAX / GLYCOLAX) 17 g packet DISSOLVE 1 PACKET IN LIQUID & [place] DAILY 02/24/19   [provider]    Allergies Gluten meal, Hydrocodone, Oxycodone, and Tramadol  No family history on file.  Social History Social History   Tobacco Use   Smoking status: Every Day    Packs/day: 0.25    Pack years: 0.00    Types: Cigarettes   Smokeless tobacco: Never  Substance Use Topics   Alcohol use: No   Drug use: No    Review of Systems Constitutional: No fever/chills Eyes: No  visual changes. ENT: + sore throat. Cardiovascular: + chest pain. Respiratory: + shortness of breath. Gastrointestinal: No abdominal pain.  + nausea, no vomiting.  No diarrhea.  No constipation. Genitourinary: Negative for dysuria. Musculoskeletal: Negative for back pain. Skin: Negative for rash. Neurological: + headaches, negative for focal weakness or numbness.   ____________________________________________   PHYSICAL EXAM:  VITAL SIGNS: ED Triage Vitals  Enc Vitals Group     BP 03/24/21 1042 133/72     Pulse Rate 03/24/21 1042 (!) 54     Resp 03/24/21 1042 20     Temp 03/24/21 1042 98.6 F (37 C)     Temp Source 03/24/21 1042 Oral     SpO2 03/24/21 1042 98 %     Weight 03/24/21 1043 235 lb (106.6 kg)     Height 03/24/21 1043 5\' 7"  (1.702 m)     Head Circumference --      Peak Flow --      Pain Score 03/24/21 1043 7     Pain Loc --      Pain Edu? --      Excl. in [place]? --     Constitutional: Alert and oriented. Well appearing and in no acute distress. Eyes: Conjunctivae mildly erythematous on the left, none on the right.  No drainage or discharge noted. PERRL. EOMI. Head: Atraumatic. Nose: No congestion/rhinnorhea. Mouth/Throat: Mucous membranes are moist.  Oropharynx erythematous with no tonsillar enlargement or exudate. Neck: No stridor.   Lymphatic: No cervical lymphadenopathy Cardiovascular: Normal rate, regular rhythm. Grossly normal heart sounds.  Good peripheral circulation. Respiratory: Mildly increased respiratory effort.  No retractions. Lungs CTAB. Gastrointestinal: Soft and nontender. No distention. No abdominal bruits. No CVA tenderness. Musculoskeletal: No lower extremity tenderness nor edema.  No joint effusions. Neurologic:  Normal speech and language. No gross focal neurologic deficits are appreciated. No gait instability. Skin:  Skin is warm, dry and intact. No rash noted. Psychiatric: Mood and affect are normal. Speech and behavior are  normal.  ____________________________________________   LABS (all labs ordered are listed, but only abnormal results are displayed)  Labs Reviewed  CBC WITH DIFFERENTIAL/PLATELET - Abnormal; Notable for the following components:      Result Value   WBC 12.8 (*)    Lymphs Abs 4.4 (*)    All other components within normal limits  COMPREHENSIVE METABOLIC PANEL - Abnormal; Notable for the following components:   Glucose, Bld 128 (*)    AST 77 (*)    ALT 123 (*)    Alkaline Phosphatase 142 (*)    All other components within normal limits  BRAIN NATRIURETIC PEPTIDE - Abnormal; Notable for the following components:   B Natriuretic Peptide 143.8 (*)    All other components within normal limits  RESP PANEL BY RT-PCR (FLU  A&B, COVID) ARPGX2  D-DIMER, QUANTITATIVE  TROPONIN I (HIGH SENSITIVITY)  TROPONIN I (HIGH SENSITIVITY)   ____________________________________________  EKG  Normal sinus rhythm with a rate of 60 bpm.  No ST elevation or depression to suggest acute ischemia. ____________________________________________  RADIOLOGY I, Marlana Salvage, personally viewed and evaluated these images (plain radiographs) as part of my medical decision making, as well as reviewing the written report by the radiologist.  ED provider interpretation: No acute cardiopulmonary disease, no evidence of acute infiltrate  Official radiology report(s): DG Chest 2 View  Result Date: 03/24/2021 CLINICAL DATA:  Productive cough, sore throat, and fever since last Monday EXAM: CHEST - 2 VIEW COMPARISON:  08/12/2020 FINDINGS: Normal heart size, mediastinal contours, and pulmonary vascularity. Lungs clear. No pleural effusion or pneumothorax. Suspected cervical ribs. No acute osseous findings. IMPRESSION: No acute abnormalities. Question cervical ribs. Electronically Signed   By: Lavonia Dana M.D.   On: 03/24/2021 13:08      ____________________________________________   INITIAL IMPRESSION / ASSESSMENT  AND PLAN / ED COURSE  As part of my medical decision making, I reviewed the following data within the [place] notes reviewed and incorporated, Labs reviewed, Radiograph reviewed, and Notes from prior ED visits        Patient is a 54 year old female who presents to the emergency department for evaluation of 1 week of illness including cough, sore throat, headache as well as chest pain and shortness of breath over the last 2 days.  She was tested for COVID and was negative.  See HPI for further details.  In triage, patient was mildly bradycardic with a rate of 54, normotensive, not hypoxic.  She was also not febrile.  Physical exam with no significant wheezing, no rales or rhonchi.  Oropharynx mildly erythematous but no tonsillar enlargement or exudate, otherwise grossly within normal limits.  Chest x-ray was obtained is negative for any acute infiltrate noted.  Laboratory evaluation was obtained with CBC, CMP, respiratory panel, D-dimer, troponin, BNP.  Respiratory panel is negative for flu or COVID.  CMP with noted mild transaminitis, however the patient does not have any right upper quadrant abdominal pain or other significant findings.  She does have history of transaminitis with a medication that was used for RA, however this had self resolved prior to today.  CBC with a mild leukocytosis of 12.8 with a left shift.  No other significant CBC findings.  D-dimer negative, troponin normal.  BNP mildly elevated at 143.8.  Differentials considered include ACS, PE, pneumonia, viral illness, bronchitis, heart failure.  No evidence of ACS on labs or EKG.  Unlikely PE with no tachycardia, no hypoxia and normal D-dimer.  Flu and COVID testing are negative.  However with the patient's elevated white count as well as significant cough and symptoms in the room, will initiate treatment with steroid and azithromycin.  Return precautions were discussed and she stable this time for  outpatient follow-up.  Clinical Course as of 03/24/21 1609  Mon Mar 24, 2021  1446 DG Chest 2 View [CR]    Clinical Course User Index [CR] Marlana Salvage, PA     ____________________________________________   FINAL CLINICAL IMPRESSION(S) / ED DIAGNOSES  Final diagnoses:  Cough  Shortness of breath  Elevated liver enzymes     ED Discharge Orders          Ordered    benzonatate (TESSALON PERLES) 100 MG capsule  3 times daily PRN  03/24/21 1529    erythromycin ophthalmic ointment  4 times daily        03/24/21 1529    predniSONE (DELTASONE) 10 MG tablet        03/24/21 1529    azithromycin (ZITHROMAX Z-PAK) 250 MG tablet        03/24/21 1529             Note:  This document was prepared using Dragon voice recognition software and may include unintentional dictation errors.    Marlana Salvage, PA 03/24/21 1609    Naaman Plummer, MD 03/24/21 Einar Crow

## 2021-10-10 ENCOUNTER — Other Ambulatory Visit: Payer: Medicaid Other

## 2021-11-04 ENCOUNTER — Other Ambulatory Visit: Payer: Self-pay

## 2021-11-04 ENCOUNTER — Ambulatory Visit (LOCAL_COMMUNITY_HEALTH_CENTER): Payer: Self-pay

## 2021-11-04 DIAGNOSIS — Z111 Encounter for screening for respiratory tuberculosis: Secondary | ICD-10-CM

## 2021-11-07 ENCOUNTER — Other Ambulatory Visit: Payer: Self-pay

## 2021-11-07 ENCOUNTER — Ambulatory Visit (LOCAL_COMMUNITY_HEALTH_CENTER): Payer: Self-pay

## 2021-11-07 DIAGNOSIS — Z111 Encounter for screening for respiratory tuberculosis: Secondary | ICD-10-CM

## 2021-11-07 LAB — TB SKIN TEST
Induration: 0 mm
TB Skin Test: NEGATIVE

## 2021-12-01 ENCOUNTER — Emergency Department: Payer: Medicaid Other

## 2021-12-01 ENCOUNTER — Observation Stay: Payer: Medicaid Other

## 2021-12-01 ENCOUNTER — Emergency Department (HOSPITAL_BASED_OUTPATIENT_CLINIC_OR_DEPARTMENT_OTHER)
Admit: 2021-12-01 | Discharge: 2021-12-01 | Disposition: A | Discharge status: Home / Self Care | Payer: Medicaid Other | Attending: Neurology | Admitting: Neurology

## 2021-12-01 ENCOUNTER — Encounter: Payer: Self-pay | Admitting: Radiology

## 2021-12-01 ENCOUNTER — Other Ambulatory Visit: Payer: Self-pay

## 2021-12-01 ENCOUNTER — Observation Stay
Admission: EM | Admit: 2021-12-01 | Discharge: 2021-12-02 | Disposition: A | Discharge status: Home / Self Care | Payer: Medicaid Other | Attending: Internal Medicine | Admitting: Internal Medicine

## 2021-12-01 DIAGNOSIS — I10 Essential (primary) hypertension: Secondary | ICD-10-CM | POA: Insufficient documentation

## 2021-12-01 DIAGNOSIS — R7303 Prediabetes: Secondary | ICD-10-CM | POA: Diagnosis not present

## 2021-12-01 DIAGNOSIS — I639 Cerebral infarction, unspecified: Secondary | ICD-10-CM

## 2021-12-01 DIAGNOSIS — Z720 Tobacco use: Secondary | ICD-10-CM | POA: Diagnosis present

## 2021-12-01 DIAGNOSIS — K219 Gastro-esophageal reflux disease without esophagitis: Secondary | ICD-10-CM | POA: Diagnosis not present

## 2021-12-01 DIAGNOSIS — R2990 Unspecified symptoms and signs involving the nervous system: Secondary | ICD-10-CM | POA: Diagnosis present

## 2021-12-01 DIAGNOSIS — E785 Hyperlipidemia, unspecified: Secondary | ICD-10-CM | POA: Diagnosis not present

## 2021-12-01 DIAGNOSIS — R0789 Other chest pain: Secondary | ICD-10-CM

## 2021-12-01 DIAGNOSIS — R531 Weakness: Secondary | ICD-10-CM | POA: Diagnosis not present

## 2021-12-01 DIAGNOSIS — R079 Chest pain, unspecified: Secondary | ICD-10-CM | POA: Diagnosis not present

## 2021-12-01 DIAGNOSIS — R001 Bradycardia, unspecified: Secondary | ICD-10-CM | POA: Diagnosis present

## 2021-12-01 DIAGNOSIS — G459 Transient cerebral ischemic attack, unspecified: Secondary | ICD-10-CM

## 2021-12-01 DIAGNOSIS — F17210 Nicotine dependence, cigarettes, uncomplicated: Secondary | ICD-10-CM | POA: Diagnosis not present

## 2021-12-01 DIAGNOSIS — M542 Cervicalgia: Secondary | ICD-10-CM

## 2021-12-01 LAB — PROTIME-INR
INR: 0.9 (ref 0.8–1.2)
Prothrombin Time: 12.6 seconds (ref 11.4–15.2)

## 2021-12-01 LAB — DIFFERENTIAL
Abs Immature Granulocytes: 0.03 10*3/uL (ref 0.00–0.07)
Basophils Absolute: 0.1 10*3/uL (ref 0.0–0.1)
Basophils Relative: 1 %
Eosinophils Absolute: 0.3 10*3/uL (ref 0.0–0.5)
Eosinophils Relative: 3 %
Immature Granulocytes: 0 %
Lymphocytes Relative: 45 %
Lymphs Abs: 5.1 10*3/uL — ABNORMAL HIGH (ref 0.7–4.0)
Monocytes Absolute: 0.5 10*3/uL (ref 0.1–1.0)
Monocytes Relative: 4 %
Neutro Abs: 5.3 10*3/uL (ref 1.7–7.7)
Neutrophils Relative %: 47 %

## 2021-12-01 LAB — COMPREHENSIVE METABOLIC PANEL
ALT: 25 U/L (ref 0–44)
AST: 29 U/L (ref 15–41)
Albumin: 3.7 g/dL (ref 3.5–5.0)
Alkaline Phosphatase: 80 U/L (ref 38–126)
Anion gap: 6 (ref 5–15)
BUN: 12 mg/dL (ref 6–20)
CO2: 26 mmol/L (ref 22–32)
Calcium: 9.3 mg/dL (ref 8.9–10.3)
Chloride: 105 mmol/L (ref 98–111)
Creatinine, Ser: 0.91 mg/dL (ref 0.44–1.00)
GFR, Estimated: >60 mL/min (ref >60)
Glucose, Bld: 111 mg/dL — ABNORMAL HIGH (ref 70–99)
Potassium: 4 mmol/L (ref 3.5–5.1)
Sodium: 137 mmol/L (ref 135–145)
Total Bilirubin: 0.8 mg/dL (ref 0.3–1.2)
Total Protein: 7.4 g/dL (ref 6.5–8.1)

## 2021-12-01 LAB — TROPONIN I (HIGH SENSITIVITY)
Troponin I (High Sensitivity): 7 ng/L (ref <18)
Troponin I (High Sensitivity): 7 ng/L (ref <18)
Troponin I (High Sensitivity): 7 ng/L (ref <18)

## 2021-12-01 LAB — CBC
HCT: 39.6 % (ref 36.0–46.0)
Hemoglobin: 12.3 g/dL (ref 12.0–15.0)
MCH: 26.2 pg (ref 26.0–34.0)
MCHC: 31.1 g/dL (ref 30.0–36.0)
MCV: 84.4 fL (ref 80.0–100.0)
Platelets: 265 10*3/uL (ref 150–400)
RBC: 4.69 MIL/uL (ref 3.87–5.11)
RDW: 12.8 % (ref 11.5–15.5)
WBC: 11.3 10*3/uL — ABNORMAL HIGH (ref 4.0–10.5)
nRBC: 0 % (ref 0.0–0.2)

## 2021-12-01 LAB — APTT: aPTT: 29 seconds (ref 24–36)

## 2021-12-01 LAB — ECHOCARDIOGRAM COMPLETE BUBBLE STUDY
AR max vel: 1.87 cm2
AV Area VTI: 2.48 cm2
AV Area mean vel: 1.98 cm2
AV Mean grad: 2.5 mmHg
AV Peak grad: 5 mmHg
Ao pk vel: 1.12 m/s
Area-P 1/2: 3.6 cm2
MV VTI: 1.83 cm2
S' Lateral: 3 cm

## 2021-12-01 LAB — CBG MONITORING, ED: Glucose-Capillary: 123 mg/dL — ABNORMAL HIGH (ref 70–99)

## 2021-12-01 LAB — HEMOGLOBIN A1C
Hgb A1c MFr Bld: 6.9 % — ABNORMAL HIGH (ref 4.8–5.6)
Mean Plasma Glucose: 151 mg/dL

## 2021-12-01 LAB — GLUCOSE, CAPILLARY: Glucose-Capillary: 128 mg/dL — ABNORMAL HIGH (ref 70–99)

## 2021-12-01 LAB — LDL CHOLESTEROL, DIRECT: Direct LDL: 199.2 mg/dL — ABNORMAL HIGH (ref 0–99)

## 2021-12-01 LAB — HIV ANTIBODY (ROUTINE TESTING W REFLEX): HIV Screen 4th Generation wRfx: NONREACTIVE

## 2021-12-01 MED ORDER — ALBUTEROL SULFATE (2.5 MG/3ML) 0.083% IN NEBU
2.5000 mg | INHALATION_SOLUTION | RESPIRATORY_TRACT | Status: DC | PRN
Start: 2021-12-01 — End: 2021-12-02

## 2021-12-01 MED ORDER — INSULIN ASPART 100 UNIT/ML IJ SOLN
0.0000 [IU] | Freq: Three times a day (TID) | INTRAMUSCULAR | Status: DC
  Administered 2021-12-02: 1 [IU] via SUBCUTANEOUS
  Filled 2021-12-01: qty 1

## 2021-12-01 MED ORDER — ACETAMINOPHEN 650 MG RE SUPP: 650.0000 mg | RECTAL | Status: DC | PRN

## 2021-12-01 MED ORDER — SODIUM CHLORIDE 0.9% FLUSH
3.0000 mL | Freq: Once | INTRAVENOUS | Status: AC
  Administered 2021-12-01: 3 mL via INTRAVENOUS

## 2021-12-01 MED ORDER — FENTANYL CITRATE PF 50 MCG/ML IJ SOSY
25.0000 ug | PREFILLED_SYRINGE | INTRAMUSCULAR | Status: DC | PRN
  Administered 2021-12-01: 22:00:00 25 ug via INTRAVENOUS
  Filled 2021-12-01: qty 1

## 2021-12-01 MED ORDER — ACETAMINOPHEN 160 MG/5ML PO SOLN
650.0000 mg | ORAL | Status: DC | PRN
  Filled 2021-12-01: qty 20.3

## 2021-12-01 MED ORDER — STROKE: EARLY STAGES OF RECOVERY BOOK: Freq: Once | Status: DC

## 2021-12-01 MED ORDER — VITAMIN B-12 1000 MCG PO TABS
1000.0000 ug | ORAL_TABLET | Freq: Every day | ORAL | Status: DC
  Administered 2021-12-02: 10:00:00 1000 ug via ORAL
  Filled 2021-12-01: qty 1

## 2021-12-01 MED ORDER — NITROGLYCERIN 0.4 MG SL SUBL: 0.4000 mg | SUBLINGUAL_TABLET | SUBLINGUAL | Status: DC | PRN

## 2021-12-01 MED ORDER — ATORVASTATIN CALCIUM 20 MG PO TABS
40.0000 mg | ORAL_TABLET | Freq: Every day | ORAL | Status: DC
  Administered 2021-12-01 – 2021-12-02 (×2): 40 mg via ORAL
  Filled 2021-12-01 (×2): qty 2

## 2021-12-01 MED ORDER — IOHEXOL 350 MG/ML SOLN
100.0000 mL | Freq: Once | INTRAVENOUS | Status: AC | PRN
  Administered 2021-12-01: 100 mL via INTRAVENOUS

## 2021-12-01 MED ORDER — ONDANSETRON HCL 4 MG/2ML IJ SOLN
4.0000 mg | Freq: Three times a day (TID) | INTRAMUSCULAR | Status: DC | PRN
  Administered 2021-12-02: 4 mg via INTRAVENOUS
  Filled 2021-12-01: qty 2

## 2021-12-01 MED ORDER — CLOPIDOGREL BISULFATE 75 MG PO TABS
75.0000 mg | ORAL_TABLET | Freq: Every day | ORAL | Status: DC
  Administered 2021-12-01 – 2021-12-02 (×2): 75 mg via ORAL
  Filled 2021-12-01 (×2): qty 1

## 2021-12-01 MED ORDER — PANTOPRAZOLE SODIUM 40 MG PO TBEC: 40.0000 mg | DELAYED_RELEASE_TABLET | Freq: Every day | ORAL | Status: DC | PRN

## 2021-12-01 MED ORDER — SENNOSIDES-DOCUSATE SODIUM 8.6-50 MG PO TABS: 1.0000 | ORAL_TABLET | Freq: Every evening | ORAL | Status: DC | PRN

## 2021-12-01 MED ORDER — ACETAMINOPHEN 325 MG PO TABS
650.0000 mg | ORAL_TABLET | ORAL | Status: DC | PRN
  Administered 2021-12-02: 08:00:00 650 mg via ORAL
  Filled 2021-12-01: qty 2

## 2021-12-01 MED ORDER — ADULT MULTIVITAMIN W/MINERALS CH
1.0000 | ORAL_TABLET | Freq: Every day | ORAL | Status: DC
  Administered 2021-12-02: 1 via ORAL
  Filled 2021-12-01: qty 1

## 2021-12-01 MED ORDER — NICOTINE 21 MG/24HR TD PT24
21.0000 mg | MEDICATED_PATCH | Freq: Every day | TRANSDERMAL | Status: DC
  Filled 2021-12-01: qty 1

## 2021-12-01 MED ORDER — ENOXAPARIN SODIUM 40 MG/0.4ML IJ SOSY: 40.0000 mg | PREFILLED_SYRINGE | INTRAMUSCULAR | Status: DC

## 2021-12-01 MED ORDER — ENOXAPARIN SODIUM 60 MG/0.6ML IJ SOSY
0.5000 mg/kg | PREFILLED_SYRINGE | INTRAMUSCULAR | Status: DC
  Administered 2021-12-01: 22:00:00 55 mg via SUBCUTANEOUS
  Filled 2021-12-01: qty 0.6

## 2021-12-01 MED ORDER — ASPIRIN 81 MG PO CHEW
81.0000 mg | CHEWABLE_TABLET | Freq: Every day | ORAL | Status: DC
  Administered 2021-12-02: 81 mg via ORAL
  Filled 2021-12-01: qty 1

## 2021-12-01 MED ORDER — GADOBUTROL 1 MMOL/ML IV SOLN
10.0000 mL | Freq: Once | INTRAVENOUS | Status: AC | PRN
  Administered 2021-12-01: 10 mL via INTRAVENOUS
  Filled 2021-12-01: qty 10

## 2021-12-01 MED ORDER — INSULIN ASPART 100 UNIT/ML IJ SOLN: 0.0000 [IU] | Freq: Every day | INTRAMUSCULAR | Status: DC

## 2021-12-01 NOTE — ED Notes (Signed)
Per MRI tech, MRI will be done around 1600.

## 2021-12-01 NOTE — ED Notes (Signed)
Pt transported to CT ?

## 2021-12-01 NOTE — ED Triage Notes (Signed)
Pt via EMS from home. EMS called out for CP, states that she started having some L sided neck pain and L sided arm numbness and facial numbness, also endorses some L sided weakness. Pt is A&OX4 and NAD>

## 2021-12-01 NOTE — Code Documentation (Signed)
Stroke Response Nurse Documentation Code Documentation  Linda Bennett is a 56 y.o. female arriving to [facility] ED via [facility] on 2/27. On No antithrombotic. Code stroke was activated by [facility].   Patient from home where she was LKW at [phone] and now complaining of left sided numbness/weakness. Pt reports that she was driving to work this am when she had sudden chest pressure that radiated to left arm. Pt then began having headache with left sided neck and arm numbness. Pt denies CP upon arrival.   Stroke team at the bedside on patient arrival. Labs drawn and patient cleared for CT by Dr. Kerman Passey. Patient to CT with team. NIHSS 2, see documentation for details and code stroke times. The following imaging was completed:  CT, CTA head and neck, CTP. Patient is not a candidate for IV Thrombolytic due to LKW >4.5 hrs.  Care/Plan: Pt to be admitted for stroke workup per Dr. Quinn Axe.   Bedside handoff with ED RN Sharyn Lull.    Velta Addison Stroke Designer, fashion/clothing

## 2021-12-01 NOTE — ED Provider Notes (Signed)
[facility] Provider Note    Event Date/Time   First MD Initiated Contact with Patient 12/01/21 1056     (approximate)  History   Chief Complaint: Code Stroke  HPI  Linda Bennett is a 56 y.o. female with a past medical history of hyperlipidemia, presents to the emergency department as a code stroke.  According to the patient she awoke overnight around 4:00 was experiencing some heaviness on her left side as well as some left-sided neck pain.  Patient states on the way to work this morning around 9:30 AM she developed chest pain which she describes as a heaviness sensation to the center of her chest and some pain radiating to her left neck and left arm.  Patient arrived to the emergency department as a code stroke, neurology met the patient on arrival.  Patient states her chest pain has since dissipated denies any pain currently.  Physical Exam   Triage Vital Signs: ED Triage Vitals  Enc Vitals Group     BP 12/01/21 1119 (!) 151/67     Pulse Rate 12/01/21 1119 (!) 48     Resp 12/01/21 1130 16     Temp --      Temp src --      SpO2 12/01/21 1119 98 %     Weight --      Height --      Head Circumference --      Peak Flow --      Pain Score --      Pain Loc --      Pain Edu? --      Excl. in GC? --     Most recent vital signs: Vitals:   12/01/21 1119 12/01/21 1130  BP: (!) 151/67   Pulse: (!) 48 (!) 45  Resp:  16  SpO2: 98% 99%    General: Awake, no distress.  CV:  Good peripheral perfusion.  Regular rate and rhythm  Resp:  Normal effort.  Equal breath sounds bilaterally.  Abd:  No distention.  Soft, nontender.  No rebound or guarding.   ED Results / Procedures / Treatments   EKG  EKG viewed and interpreted by myself shows a normal sinus rhythm/sinus bradycardia at 47 bpm with a narrow QRS, normal axis, normal intervals, no concerning ST changes.  RADIOLOGY  I personally reviewed the CT head images, no acute abnormality seen on my  evaluation. Radiology is read the CT head/perfusion scan as no acute finding.  No large vessel occlusion.  Perfusion imaging demonstrates no infarction.  MEDICATIONS ORDERED IN ED: Medications  sodium chloride flush (NS) 0.9 % injection 3 mL (has no administration in time range)  iohexol (OMNIPAQUE) 350 MG/ML injection 100 mL (100 mLs Intravenous Contrast Given 12/01/21 1106)     IMPRESSION / MDM / ASSESSMENT AND PLAN / ED COURSE  I reviewed the triage vital signs and the nursing notes.  Patient presents emergency department for left-sided symptoms including left arm, left neck and left face heaviness sensation that was present when she awoke from sleep around 4 AM and then returned around 9:30 AM when she began experiencing pain in her chest as well.  Patient states she is now chest pain-free.  Patient's work-up in the emergency department has been so far nonrevealing.  CT perfusion/head shows no significant findings.  Patient's lab work is largely within normal limits including a CBC, CBG, INR.  Patient's chemistry and troponin are pending.  Patient has been seen by  neurology as she was a code stroke.  See neurology note for NIH stroke scale.  Patient is not a TNK candidate given onset of symptoms overnight with last known normal before going to bed last night.  Perfusion scan negative for any large vessel occlusion.  We will admit for stroke/TIA work-up once the patient's emergency department work-up is been finished.  EKG is reassuring, troponin is pending.  Patient is chest pain and symptom-free at this time.  Patient's labs show slight leukocytosis otherwise normal CBC.  Reassuring chemistry.  Troponin is pending still due to lab machine being down.  Patient will require admission to the hospital service for further work-up and treatment per neurology.  FINAL CLINICAL IMPRESSION(S) / ED DIAGNOSES   Chest pain TIA   Note:  This document was prepared using Dragon voice recognition software  and may include unintentional dictation errors.   Harvest Dark, MD 12/01/21 1414

## 2021-12-01 NOTE — ED Notes (Signed)
Informed RN bed assigned 

## 2021-12-01 NOTE — Progress Notes (Signed)
CODE STROKE- PHARMACY COMMUNICATION   Time CODE STROKE called/page received: 12/01/21 at 1040  Time response to CODE STROKE was made (in person or via phone): 1155  Time Stroke Kit retrieved from [place] (only if needed): N/A  Name of Provider/Nurse contacted: Spoke with Dr. Quinn Axe, Neurologist. She was not a TNK candidate 2/2 presentation outside the window  Past Medical History:  Diagnosis Date   Celiac disease    Hidradenitis suppurativa    High cholesterol    Pre-diabetes    Psoriasis    Psoriatic arthritis ([place])    Vitamin D deficiency    Prior to Admission medications   Medication Sig Start Date End Date Taking? Authorizing Provider  benzonatate (TESSALON PERLES) 100 MG capsule Take 1 capsule (100 mg total) by mouth 3 (three) times daily as needed for cough. Patient not taking: Reported on 11/04/2021 03/24/21 03/24/22  Marlana Salvage, PA  EPINEPHrine (EPIPEN 2-PAK) 0.3 mg/0.3 mL IJ SOAJ injection Inject 0.3 mLs (0.3 mg total) into the muscle as needed for anaphylaxis. 10/15/19   Arta Silence, MD  erythromycin ophthalmic ointment Place 1 application into the left eye 4 (four) times daily. Patient not taking: Reported on 11/04/2021 03/24/21   Marlana Salvage, PA  Multiple Vitamin (MULTIVITAMIN) tablet Take 1 tablet by mouth daily.    [provider]  pantoprazole (PROTONIX) 40 MG tablet Take 40 mg by mouth daily. Patient not taking: Reported on 11/04/2021    [provider]  polyethylene glycol (MIRALAX / GLYCOLAX) 17 g packet DISSOLVE 1 PACKET IN LIQUID & [place] Patient not taking: Reported on 11/04/2021 02/24/19   [provider]    Darrick Penna ,PharmD Clinical Pharmacist  12/01/2021  11:25 AM

## 2021-12-01 NOTE — ED Notes (Signed)
Medications not verified by pharmacy at this time.

## 2021-12-01 NOTE — Progress Notes (Signed)
SLP Cancellation Note  Patient Details Name: Monta Maiorana MRN: [id number] DOB: 12-14-1965   Cancelled treatment:       Reason Eval/Treat Not Completed: SLP screened, no needs identified, will sign off (chart reviewed; consulted NSG then met w/ pt in room) Pt denied any difficulty swallowing and is now on a regular diet after passing her [place] swallow screen; tolerates swallowing pills w/ water per NSG. Gave [facility] contact information for ordering her meals. Pt conversed in conversation w/out expressive/receptive deficits noted; pt denied any speech-language deficits now. Speech clear. She was face-timing using her phone w/out difficulty reported. No further skilled ST services indicated as pt appears at her baseline. Pt agreed. NSG to reconsult if any change in status while admitted.       Orinda Kenner, MS, CCC-SLP Speech Language Pathologist [facility]; [place] [phone] (ascom) Dylanie Quesenberry 12/01/2021, 3:51 PM

## 2021-12-01 NOTE — ED Notes (Signed)
Family at bedside. 

## 2021-12-01 NOTE — ED Notes (Signed)
Pt c/o neck & L shoulder pain that started at 0400. Pt also reports intermittent HA she describes as pressure in both the front & back.

## 2021-12-01 NOTE — Progress Notes (Signed)
*  PRELIMINARY RESULTS* Echocardiogram 2D Echocardiogram has been performed.  Linda Bennett 12/01/2021, 2:31 PM

## 2021-12-01 NOTE — H&P (Signed)
History and Physical    Linda Bennett QVZ:[id number] DOB: [birth year] DOA: 12/01/2021  Referring MD/NP/PA:   PCP: [facility] [place]   Patient coming from:  The patient is coming from home.  At baseline, pt is independent for most of ADL.        Chief Complaint: Left-sided numbness and weakness and chest pressure  HPI: Linda Bennett is a 56 y.o. female with medical history significant of hypertension, prediabetes, GERD, psoriatic arthritis, celiac disease, tobacco use, who presents with left-sided numbness and weakness, and chest pressure.  Patient was last known normal at about 4:15 this morning, then she developed chest pain which is located in the substernal area, 9 out of 10 in severity, pressure-like, radiating to the left neck. She states that her chest pain has spontaneously resolved, then she developed numbness and weakness in the left side of her body, from her left ear to left arm and left leg.  She had some difficulty speaking which has quickly resolved.  No vision loss.  She also complains of mild pain in the left arm and the left leg. She has nausea, no vomiting, diarrhea or abdominal pain.  No symptoms of UTI.  Patient has mild dry cough, no shortness of breath, fever or chills.  No symptoms of UTI.  Data Reviewed and ED Course: pt was found to have trop 7, WBC 11.3, INR 0.9, PTT 29, pending COVID PCR, electrolytes renal function okay, blood pressure 145/71, heart rate 45-50s, RR 20, oxygen saturation 98% on room air.  CT of head is negative for acute intracranial abnormalities.  CT angiogram of the head and neck is negative for LVO, CTA of the brain perfusion is negative.  Chest x-ray negative.  Patient is placed on telemetry bed for observation.  Dr. Quinn Axe of neurology is consulted.  EKG: I have personally reviewed.  Sinus rhythm, QTc 410, early progression, heart rate 47, nonspecific T wave change  Review of Systems:   General: no fevers, chills, no body  weight gain, has fatigue HEENT: no blurry vision, hearing changes or sore throat Respiratory: no dyspnea, coughing, wheezing CV: has chest pain, no palpitations GI: has nausea,  no vomiting, abdominal pain, diarrhea, constipation GU: no dysuria, burning on urination, increased urinary frequency, hematuria  Ext: no leg edema Neuro: Has left-sided numbness no weakness, has difficulty speaking Skin: no rash, no skin tear. MSK: No muscle spasm, no deformity, no limitation of range of movement in spin.  Has left arm and leg pain, has left neck pain Heme: No easy bruising.  Travel history: No recent long distant travel.   Allergy:  Allergies  Allergen Reactions   Hydrocodone Itching and Hives    Other reaction(s): Hives Other reaction(s): Hives    Gluten Meal Other (See Comments)    Celiac disease Other reaction(s): Other (See Comments) Celiac disease Other reaction(s): has celiac Celiac disease    Oxycodone Itching   Tramadol Itching   Shrimp Extract Allergy Skin Test Itching    Past Medical History:  Diagnosis Date   Celiac disease    Hidradenitis suppurativa    High cholesterol    Pre-diabetes    Psoriasis    Psoriatic arthritis ([place])    Vitamin D deficiency     Past Surgical History:  Procedure Laterality Date   BACK SURGERY     CHOLECYSTECTOMY N/A 02/25/2019   Procedure: LAPAROSCOPIC CHOLECYSTECTOMY;  Surgeon: Jules Husbands, MD;  Location: [facility] ORS;  Service: General;  Laterality: N/A;  LIVER BIOPSY     SHOULDER SURGERY     TONSILLECTOMY     TUBAL LIGATION      Social History:  reports that she has been smoking cigarettes. She has been smoking an average of .25 packs per day. She has never used smokeless tobacco. She reports that she does not drink alcohol and does not use drugs.  Family History:  Family History  Problem Relation Age of Onset   Stroke Brother    Prostate cancer Brother      Prior to Admission medications   Medication Sig Start Date End  Date Taking? Authorizing Provider  Multiple Vitamin (MULTIVITAMIN) tablet Take 1 tablet by mouth daily.   Yes [provider]  vitamin B-12 (CYANOCOBALAMIN) 1000 MCG tablet Take 1,000 mcg by mouth daily.   Yes [provider]  benzonatate (TESSALON PERLES) 100 MG capsule Take 1 capsule (100 mg total) by mouth 3 (three) times daily as needed for cough. Patient not taking: Reported on 11/04/2021 03/24/21 03/24/22  Marlana Salvage, PA  EPINEPHrine (EPIPEN 2-PAK) 0.3 mg/0.3 mL IJ SOAJ injection Inject 0.3 mLs (0.3 mg total) into the muscle as needed for anaphylaxis. Patient not taking: Reported on 12/01/2021 10/15/19   Arta Silence, MD  erythromycin ophthalmic ointment Place 1 application into the left eye 4 (four) times daily. Patient not taking: Reported on 11/04/2021 03/24/21   Marlana Salvage, PA  pantoprazole (PROTONIX) 40 MG tablet Take 40 mg by mouth daily. Patient not taking: Reported on 11/04/2021    [provider]  polyethylene glycol (MIRALAX / GLYCOLAX) 17 g packet DISSOLVE 1 PACKET [place] Patient not taking: Reported on 11/04/2021 02/24/19   [provider]    Physical Exam: Vitals:   12/01/21 1315 12/01/21 1330 12/01/21 1345 12/01/21 1454  BP: 139/76 (!) 145/79 (!) 141/70   Pulse:  (!) 48 (!) 50   Resp: 16 13 13    SpO2:  98% 97%   Weight:    109.1 kg   General: Not in acute distress HEENT:       Eyes: PERRL, EOMI, no scleral icterus.       ENT: No discharge from the ears and nose, no pharynx injection, no tonsillar enlargement.        Neck: No JVD, no bruit, no mass felt. Heme: No neck lymph node enlargement. Cardiac: S1/S2, RRR, No murmurs, No gallops or rubs. Respiratory: No rales, wheezing, rhonchi or rubs. GI: Soft, nondistended, nontender, no rebound pain, no organomegaly, BS present. GU: No hematuria Ext: No pitting leg edema bilaterally. 1+DP/PT pulse bilaterally. Musculoskeletal: No joint deformities,  No joint redness or warmth, no limitation of ROM in spin. Skin: No rashes.  Neuro: Alert, oriented X3, cranial nerves II-XII grossly intact, moves all extremities normally. Muscle strength is minimally decreased in left arm and left leg,  sensation to light touch intact. Brachial reflex 2+ bilaterally.  Psych: Patient is not psychotic, no suicidal or hemocidal ideation.  Labs on Admission: I have personally reviewed following labs and imaging studies  CBC: Recent Labs  Lab 12/01/21 1054  WBC 11.3*  NEUTROABS 5.3  HGB 12.3  HCT 39.6  MCV 84.4  PLT 161   Basic Metabolic Panel: Recent Labs  Lab 12/01/21 1054  NA 137  K 4.0  CL 105  CO2 26  GLUCOSE 111*  BUN 12  CREATININE 0.91  CALCIUM 9.3   GFR: CrCl cannot be calculated (Unknown ideal weight.). Liver Function Tests: Recent  Labs  Lab 12/01/21 1054  AST 29  ALT 25  ALKPHOS 80  BILITOT 0.8  PROT 7.4  ALBUMIN 3.7   No results for input(s): LIPASE, AMYLASE in the last 168 hours. No results for input(s): AMMONIA in the last 168 hours. Coagulation Profile: Recent Labs  Lab 12/01/21 1054  INR 0.9   Cardiac Enzymes: No results for input(s): CKTOTAL, CKMB, CKMBINDEX, TROPONINI in the last 168 hours. BNP (last 3 results) No results for input(s): PROBNP in the last 8760 hours. HbA1C: No results for input(s): HGBA1C in the last 72 hours. CBG: Recent Labs  Lab 12/01/21 1049  GLUCAP 123*   Lipid Profile: No results for input(s): CHOL, HDL, LDLCALC, TRIG, CHOLHDL, LDLDIRECT in the last 72 hours. Thyroid Function Tests: No results for input(s): TSH, T4TOTAL, FREET4, T3FREE, THYROIDAB in the last 72 hours. Anemia Panel: No results for input(s): VITAMINB12, FOLATE, FERRITIN, TIBC, IRON, RETICCTPCT in the last 72 hours. Urine analysis:    Component Value Date/Time   COLORURINE YELLOW (A) 09/16/2019 2014   APPEARANCEUR HAZY (A) 09/16/2019 2014   APPEARANCEUR Clear 05/15/2014 1528   LABSPEC 1.011 09/16/2019 2014    LABSPEC 1.004 05/15/2014 1528   PHURINE 7.0 09/16/2019 2014   GLUCOSEU NEGATIVE 09/16/2019 2014   GLUCOSEU Negative 05/15/2014 1528   HGBUR NEGATIVE 09/16/2019 2014   [place] NEGATIVE 09/16/2019 2014   BILIRUBINUR Negative 05/15/2014 [place] 09/16/2019 2014   PROTEINUR NEGATIVE 09/16/2019 2014   NITRITE NEGATIVE 09/16/2019 2014   LEUKOCYTESUR NEGATIVE 09/16/2019 2014   LEUKOCYTESUR Negative 05/15/2014 1528   Sepsis Labs: @LABRCNTIP (procalcitonin:4,lacticidven:4) )No results found for this or any previous visit (from the past 240 hour(s)).   Radiological Exams on Admission: DG Chest Port 1 View  Result Date: 12/01/2021 CLINICAL DATA:  Altered mental status, chest pain EXAM: PORTABLE CHEST 1 VIEW COMPARISON:  03/24/2021 FINDINGS: The heart size and mediastinal contours are within normal limits. Both lungs are clear. The visualized skeletal structures are unremarkable. IMPRESSION: No active disease. Electronically Signed   By: Elmer Picker M.D.   On: 12/01/2021 14:52   ECHOCARDIOGRAM COMPLETE BUBBLE STUDY  Result Date: 12/01/2021    ECHOCARDIOGRAM REPORT   Patient Name:   MARLYCE MCDOUGALD Date of Exam: 12/01/2021 Medical Rec #:  [id number]     Height:       67.0 in Accession #:    [id number]    Weight:       235.0 lb Date of Birth:  1965-11-03     BSA:          2.166 m Patient Age:    63 years      BP:           141/70 mmHg Patient Gender: F             HR:           50 bpm. Exam Location:  [facility] Procedure: 2D Echo, Cardiac Doppler, Color Doppler and Saline Contrast Bubble            Study Indications:     Stroke 434.91 / I63.9  History:         Patient has no prior history of Echocardiogram examinations.                  Stroke; Signs/Symptoms:Chest Pain. Tobacco use.  Sonographer:     Sherrie Sport Referring Phys:  [place] Diagnosing Phys: Kathlyn Sacramento MD IMPRESSIONS  1. Left ventricular ejection fraction, by estimation,  is 55 to 60%. The left ventricle  has normal function. The left ventricle has no regional wall motion abnormalities. Left ventricular diastolic parameters were normal.  2. Right ventricular systolic function is normal. The right ventricular size is normal. Tricuspid regurgitation signal is inadequate for assessing PA pressure.  3. Left atrial size was mildly dilated.  4. The mitral valve is normal in structure. No evidence of mitral valve regurgitation. No evidence of mitral stenosis.  5. The aortic valve is normal in structure. Aortic valve regurgitation is not visualized. No aortic stenosis is present.  6. Agitated saline contrast bubble study was negative, with no evidence of any interatrial shunt. Conclusion(s)/Recommendation(s): No intracardiac source of embolism detected on this transthoracic study. Consider a transesophageal echocardiogram to exclude cardiac source of embolism if clinically indicated. FINDINGS  Left Ventricle: Left ventricular ejection fraction, by estimation, is 55 to 60%. The left ventricle has normal function. The left ventricle has no regional wall motion abnormalities. The left ventricular internal cavity size was normal in size. There is  no left ventricular hypertrophy. Left ventricular diastolic parameters were normal. Right Ventricle: The right ventricular size is normal. No increase in right ventricular wall thickness. Right ventricular systolic function is normal. Tricuspid regurgitation signal is inadequate for assessing PA pressure. Left Atrium: Left atrial size was mildly dilated. Right Atrium: Right atrial size was normal in size. Pericardium: There is no evidence of pericardial effusion. Mitral Valve: The mitral valve is normal in structure. No evidence of mitral valve regurgitation. No evidence of mitral valve stenosis. MV peak gradient, 2.4 mmHg. The mean mitral valve gradient is 1.0 mmHg. Tricuspid Valve: The tricuspid valve is normal in structure. Tricuspid valve regurgitation is not demonstrated. No  evidence of tricuspid stenosis. Aortic Valve: The aortic valve is normal in structure. Aortic valve regurgitation is not visualized. No aortic stenosis is present. Aortic valve mean gradient measures 2.5 mmHg. Aortic valve peak gradient measures 5.0 mmHg. Aortic valve area, by VTI measures 2.48 cm. Pulmonic Valve: The pulmonic valve was normal in structure. Pulmonic valve regurgitation is not visualized. No evidence of pulmonic stenosis. Aorta: The aortic root is normal in size and structure. Venous: The inferior vena cava was not well visualized. IAS/Shunts: No atrial level shunt detected by color flow Doppler. Agitated saline contrast was given intravenously to evaluate for intracardiac shunting. Agitated saline contrast bubble study was negative, with no evidence of any interatrial shunt.  LEFT VENTRICLE PLAX 2D LVIDd:         4.40 cm   Diastology LVIDs:         3.00 cm   LV e' medial:    5.87 cm/s LV PW:         1.40 cm   LV E/e' medial:  9.6 LV IVS:        1.10 cm   LV e' lateral:   8.81 cm/s LVOT diam:     2.00 cm   LV E/e' lateral: 6.4 LV SV:         54 LV SV Index:   25 LVOT Area:     3.14 cm  RIGHT VENTRICLE RV Basal diam:  2.80 cm RV S prime:     10.60 cm/s TAPSE (M-mode): 1.3 cm LEFT ATRIUM             Index        RIGHT ATRIUM           Index LA diam:        3.30  cm 1.52 cm/m   RA Area:     17.60 cm LA Vol (A2C):   46.0 ml 21.24 ml/m  RA Volume:   52.20 ml  24.10 ml/m LA Vol (A4C):   62.3 ml 28.77 ml/m LA Biplane Vol: 56.3 ml 26.00 ml/m  AORTIC VALVE                    PULMONIC VALVE AV Area (Vmax):    1.87 cm     PV Vmax:        0.61 m/s AV Area (Vmean):   1.98 cm     PV Vmean:       45.100 cm/s AV Area (VTI):     2.48 cm     PV VTI:         0.124 m AV Vmax:           112.00 cm/s  PV Peak grad:   1.5 mmHg AV Vmean:          73.000 cm/s  PV Mean grad:   1.0 mmHg AV VTI:            0.217 m      RVOT Peak grad: 4 mmHg AV Peak Grad:      5.0 mmHg AV Mean Grad:      2.5 mmHg LVOT Vmax:          66.60 cm/s LVOT Vmean:        46.100 cm/s LVOT VTI:          0.171 m LVOT/AV VTI ratio: 0.79  AORTA Ao Root diam: 3.30 cm MITRAL VALVE               TRICUSPID VALVE MV Area (PHT): 3.60 cm    TR Peak grad:   8.2 mmHg MV Area VTI:   1.83 cm    TR Vmax:        143.00 cm/s MV Peak grad:  2.4 mmHg MV Mean grad:  1.0 mmHg    SHUNTS MV Vmax:       0.78 m/s    Systemic VTI:  0.17 m MV Vmean:      41.6 cm/s   Systemic Diam: 2.00 cm MV Decel Time: 211 msec    Pulmonic VTI:  0.218 m MV E velocity: 56.10 cm/s MV A velocity: 74.10 cm/s MV E/A ratio:  0.76 Kathlyn Sacramento MD Electronically signed by Kathlyn Sacramento MD Signature Date/Time: 12/01/2021/3:07:40 PM    Final    CT HEAD CODE STROKE WO CONTRAST  Result Date: 12/01/2021 CLINICAL DATA:  Code stroke. Neuro deficit, acute, stroke suspected, left sided deficit since 4am EXAM: CT ANGIOGRAPHY HEAD AND NECK CT PERFUSION BRAIN TECHNIQUE: Multidetector CT imaging of the head and neck was performed using the standard protocol during bolus administration of intravenous contrast. Multiplanar CT image reconstructions and MIPs were obtained to evaluate the vascular anatomy. Carotid stenosis measurements (when applicable) are obtained utilizing NASCET criteria, using the distal internal carotid diameter as the denominator. Multiphase CT imaging of the brain was performed following IV bolus contrast injection. Subsequent parametric perfusion maps were calculated using RAPID software. RADIATION DOSE REDUCTION: This exam was performed according to the departmental dose-optimization program which includes automated exposure control, adjustment of the mA and/or kV according to patient size and/or use of iterative reconstruction technique. CONTRAST:  123mL OMNIPAQUE IOHEXOL 350 MG/ML SOLN COMPARISON:  None. FINDINGS: CT HEAD Brain: There is no acute intracranial hemorrhage, mass effect, or edema. Gray-white differentiation is preserved.  There is no extra-axial fluid collection. Ventricles  and sulci are within normal limits in size and configuration. Vascular: No hyperdense vessel or unexpected calcification. Skull: Calvarium is unremarkable. Sinuses/Orbits: No acute finding. Other: None. Review of the MIP images confirms the above findings CTA NECK Aortic arch: Great vessel origins are patent. Right carotid system: Patent. No stenosis. Left carotid system: Patent. No stenosis. Vertebral arteries: Patent. Right vertebral is dominant. No stenosis. Skeleton: Mild cervical spine degenerative changes. Other neck: Unremarkable. Upper chest: Included upper lungs are clear. Review of the MIP images confirms the above findings CTA HEAD Anterior circulation: Intracranial internal carotid arteries are patent. Anterior and middle cerebral arteries are patent. Anterior communicating artery is present. Posterior circulation: Intracranial vertebral arteries are patent. Basilar artery is patent. Major cerebellar artery origins are patent. Bilateral posterior communicating arteries are present. Posterior cerebral arteries are patent. Venous sinuses: Patent as allowed by contrast bolus timing. Review of the MIP images confirms the above findings CT Brain Perfusion Findings: ASPECTS: 10 CBF (<30%) Volume: 22mL Perfusion (Tmax>6.0s) volume: 30mL Mismatch Volume: 73mL Infarction Location: None. IMPRESSION: There is no acute intracranial hemorrhage or evidence of acute infarction. ASPECT score is 10. No large vessel occlusion, hemodynamically significant stenosis, or evidence of dissection. Perfusion imaging demonstrates no evidence of core infarction or penumbra. Preliminary results were communicated to Dr. Quinn Axe at 11:17 am on 12/01/2021 by text page via the [facility] messaging system. Electronically Signed   By: Macy Mis M.D.   On: 12/01/2021 11:22   CT ANGIO HEAD NECK W WO CM W PERF (CODE STROKE)  Result Date: 12/01/2021 CLINICAL DATA:  Code stroke. Neuro deficit, acute, stroke suspected, left sided deficit since  4am EXAM: CT ANGIOGRAPHY HEAD AND NECK CT PERFUSION BRAIN TECHNIQUE: Multidetector CT imaging of the head and neck was performed using the standard protocol during bolus administration of intravenous contrast. Multiplanar CT image reconstructions and MIPs were obtained to evaluate the vascular anatomy. Carotid stenosis measurements (when applicable) are obtained utilizing NASCET criteria, using the distal internal carotid diameter as the denominator. Multiphase CT imaging of the brain was performed following IV bolus contrast injection. Subsequent parametric perfusion maps were calculated using RAPID software. RADIATION DOSE REDUCTION: This exam was performed according to the departmental dose-optimization program which includes automated exposure control, adjustment of the mA and/or kV according to patient size and/or use of iterative reconstruction technique. CONTRAST:  175mL OMNIPAQUE IOHEXOL 350 MG/ML SOLN COMPARISON:  None. FINDINGS: CT HEAD Brain: There is no acute intracranial hemorrhage, mass effect, or edema. Gray-white differentiation is preserved. There is no extra-axial fluid collection. Ventricles and sulci are within normal limits in size and configuration. Vascular: No hyperdense vessel or unexpected calcification. Skull: Calvarium is unremarkable. Sinuses/Orbits: No acute finding. Other: None. Review of the MIP images confirms the above findings CTA NECK Aortic arch: Great vessel origins are patent. Right carotid system: Patent. No stenosis. Left carotid system: Patent. No stenosis. Vertebral arteries: Patent. Right vertebral is dominant. No stenosis. Skeleton: Mild cervical spine degenerative changes. Other neck: Unremarkable. Upper chest: Included upper lungs are clear. Review of the MIP images confirms the above findings CTA HEAD Anterior circulation: Intracranial internal carotid arteries are patent. Anterior and middle cerebral arteries are patent. Anterior communicating artery is present.  Posterior circulation: Intracranial vertebral arteries are patent. Basilar artery is patent. Major cerebellar artery origins are patent. Bilateral posterior communicating arteries are present. Posterior cerebral arteries are patent. Venous sinuses: Patent as allowed by contrast bolus timing. Review of the MIP  images confirms the above findings CT Brain Perfusion Findings: ASPECTS: 10 CBF (<30%) Volume: 88mL Perfusion (Tmax>6.0s) volume: 64mL Mismatch Volume: 30mL Infarction Location: None. IMPRESSION: There is no acute intracranial hemorrhage or evidence of acute infarction. ASPECT score is 10. No large vessel occlusion, hemodynamically significant stenosis, or evidence of dissection. Perfusion imaging demonstrates no evidence of core infarction or penumbra. Preliminary results were communicated to Dr. Quinn Axe at 11:17 am on 12/01/2021 by text page via the [facility] messaging system. Electronically Signed   By: Macy Mis M.D.   On: 12/01/2021 11:22      Assessment/Plan Principal Problem:   Stroke-like symptom Active Problems:   Chest pain   HLD (hyperlipidemia)   Tobacco use   GERD (gastroesophageal reflux disease)   Sinus bradycardia   Stroke-like symptom: Patient's symptoms are concerning for possible stroke, but patient also has pain in the right arm, right leg and neck.  Potentially differential diagnosis is cervical spine radiculopathy.  CT head negative.  CT angiogram negative.  Dr. Quinn Axe of neurology is consulted, recommending MRI for brain and MRI of C-spine, and stroke work-up.   - Placed on tele bed for observation - Obtain MRI-brain and MRI-C spin - Allow permissive HTN in the setting of acute stroke, for SBP>220 or dBP>120 - start lipitor 40 mg daily - Continue Plavix and add ASA   - fasting lipid panel and HbA1c  - 2D transthoracic echocardiography  - swallowing screen. If fails, will get SLP - Check UDS  - PT/OT consult  Chest pain: Chest pain has resolved.  Troponin 7, unclear  etiology, may.  Due to demand ischemia secondary to stroke -Trend troponin -Follow-up with A1c, FLP -Check UDS -As needed nitroglycerin, and fentanyl for chest pain -Patient is on aspirin, Plavix, Lipitor  HLD (hyperlipidemia) -Started Lipitor  Tobacco use -Nicotine patch -Did counseling about importance of quitting tobacco  GERD (gastroesophageal reflux disease) -Protonix  Sinus bradycardia: Heart rate 45-50s.  Currently no symptoms -Telemetry monitor         DVT ppx: SQ Lovenox  Code Status: Full code  Family Communication: not done, no family member is at bed side.      Disposition Plan:  Anticipate discharge back to previous environment  Consults called: Dr. Quinn Axe of neuro  Admission status and Level of care: Telemetry Medical:    for obs      Severity of Illness:  The appropriate patient status for this patient is OBSERVATION. Observation status is judged to be reasonable and necessary in order to provide the required intensity of service to ensure the patient's safety. The patient's presenting symptoms, physical exam findings, and initial radiographic and laboratory data in the context of their medical condition is felt to place them at decreased risk for further clinical deterioration. Furthermore, it is anticipated that the patient will be medically stable for discharge from the hospital within 2 midnights of admission.        Date of Service 12/01/2021    [facility]   If 7PM-7AM, please contact night-coverage [email or web address] 12/01/2021, 4:21 PM

## 2021-12-01 NOTE — ED Notes (Addendum)
This RN called the lab re: add on troponin. Lab verified it will be run, but can't guarantee how long it will take to post. Dr. Kerman Passey updated.

## 2021-12-01 NOTE — Consult Note (Signed)
NEUROLOGY CONSULTATION NOTE   Date of service: December 01, 2021 Patient Name: Linda Bennett MRN:  [id number] DOB:  1966/02/18 Reason for consult: stroke code Requesting physician: Dr. Harvest Dark _ _ _   _ __   _ __ _ _  __ __   _ __   __ _  History of Present Illness   This is a 56 yo woman with hx celiac disease, psoriatic arthritis, pre-diabetes and HL who was BIB EMS for stroke-like sx. LKW [id number] when she was driving to work and developed chest pain (intense pressure) with some radiation to her LUE. She then developed a headache with sharp left sided neck pain that extended down her whole L side. She still has neck pain although it is now localized to her L neck only. CP has resolved. She states her L side feels heavier but on exam her only sensory deficit is R face. She is anti-gravity with drift to bed in LLE, otherwise 5/5 strength throughout all extremities. NIHSS = 2 for LLE drift and sensory deficit. She was not a TNK candidate 2/2 presentation outside the window. Head CT showed NAICP. CTA was negative for LVO or dissection. CTP showed no e/o core infarct or penumbra.  CNS imaging personally reviewed.   ROS   Per HPI: all other systems reviewed and are negative  Past History   I have reviewed the following:  Past Medical History:  Diagnosis Date   Celiac disease    Hidradenitis suppurativa    High cholesterol    Pre-diabetes    Psoriasis    Psoriatic arthritis ([place])    Vitamin D deficiency    Past Surgical History:  Procedure Laterality Date   BACK SURGERY     CHOLECYSTECTOMY N/A 02/25/2019   Procedure: LAPAROSCOPIC CHOLECYSTECTOMY;  Surgeon: Jules Husbands, MD;  Location: [facility] ORS;  Service: General;  Laterality: N/A;   LIVER BIOPSY     SHOULDER SURGERY     TONSILLECTOMY     TUBAL LIGATION     No family history on file. Social History   Socioeconomic History   Marital status: Single    Spouse name: Not on file   Number of children: Not on file   Years  of education: Not on file   Highest education level: Not on file  Occupational History   Not on file  Tobacco Use   Smoking status: Every Day    Packs/day: 0.25    Types: Cigarettes   Smokeless tobacco: Never  Substance and Sexual Activity   Alcohol use: No   Drug use: No   Sexual activity: Never  Other Topics Concern   Not on file  Social History Narrative   Not on file   Social Determinants of Health   Financial Resource Strain: Not on file  Food Insecurity: Not on file  Transportation Needs: Not on file  Physical Activity: Not on file  Stress: Not on file  Social Connections: Not on file   Allergies  Allergen Reactions   Gluten Meal Other (See Comments)    Celiac disease Other reaction(s): Other (See Comments) Celiac disease Other reaction(s): has celiac Celiac disease    Hydrocodone Itching   Oxycodone Itching   Tramadol Itching    Medications   (Not in a hospital admission)     Current Facility-Administered Medications:    sodium chloride flush (NS) 0.9 % injection 3 mL, 3 mL, Intravenous, Once, Harvest Dark, MD  Current Outpatient Medications:    benzonatate (TESSALON  PERLES) 100 MG capsule, Take 1 capsule (100 mg total) by mouth 3 (three) times daily as needed for cough. (Patient not taking: Reported on 11/04/2021), Disp: 30 capsule, Rfl: 0   EPINEPHrine (EPIPEN 2-PAK) 0.3 mg/0.3 mL IJ SOAJ injection, Inject 0.3 mLs (0.3 mg total) into the muscle as needed for anaphylaxis., Disp: 1 each, Rfl: 0   erythromycin ophthalmic ointment, Place 1 application into the left eye 4 (four) times daily. (Patient not taking: Reported on 11/04/2021), Disp: 3.5 g, Rfl: 0   Multiple Vitamin (MULTIVITAMIN) tablet, Take 1 tablet by mouth daily., Disp: , Rfl:    pantoprazole (PROTONIX) 40 MG tablet, Take 40 mg by mouth daily. (Patient not taking: Reported on 11/04/2021), Disp: , Rfl:    polyethylene glycol (MIRALAX / GLYCOLAX) 17 g packet, DISSOLVE 1 PACKET IN LIQUID &  DRINK ONCE DAILY (Patient not taking: Reported on 11/04/2021), Disp: , Rfl:   Vitals   There were no vitals filed for this visit.   There is no height or weight on file to calculate BMI.  Physical Exam   Physical Exam Gen: A&O x4, NAD HEENT: Atraumatic, normocephalic;mucous membranes moist; oropharynx clear, tongue without atrophy or fasciculations. Neck: Supple, trachea midline. Resp: CTAB, no w/r/r CV: RRR, no m/g/r; nml S1 and S2. 2+ symmetric peripheral pulses. Abd: soft/NT/ND; nabs x 4 quad Extrem: Nml bulk; no cyanosis, clubbing, or edema.  Neuro: *MS: A&O x4. Follows multi-step commands.  *Speech: fluid, nondysarthric, able to name and repeat *CN:    I: Deferred   II,III: PERRLA, VFF by confrontation, optic discs unable to be visualized 2/2 pupillary constriction   III,IV,VI: EOMI w/o nystagmus, no ptosis   V: Decreased sensation to R face   VII: Eyelid closure was full.  Smile symmetric.   VIII: Hearing intact to voice   IX,X: Voice normal, palate elevates symmetrically    XI: SCM/trap 5/5 bilat   XII: Tongue protrudes midline, no atrophy or fasciculations   *Motor:   Normal bulk.  No tremor, rigidity or bradykinesia. No pronator drift. 5/5 strength in all extremities except drift to bed LLE.  *Sensory: Intact to light touch, pinprick, temperature vibration throughout. Symmetric. Propioception intact bilat.  No double-simultaneous extinction.  *Coordination:  FNF intact bilat *Reflexes:  2+ and symmetric throughout without clonus; toes down-going bilat *Gait: deferred  NIHSS = 2 (LLE drift and sensory deficit)  Premorbid mRS = 0   Labs   CBC:  Recent Labs  Lab 12/01/21 1054  WBC 11.3*  NEUTROABS 5.3  HGB 12.3  HCT 39.6  MCV 84.4  PLT 924    Basic Metabolic Panel:  Lab Results  Component Value Date   NA 138 03/24/2021   K 3.9 03/24/2021   CO2 26 03/24/2021   GLUCOSE 128 (H) 03/24/2021   BUN 8 03/24/2021   CREATININE 0.76 03/24/2021   CALCIUM  8.9 03/24/2021   GFRNONAA >60 03/24/2021   GFRAA >60 09/16/2019   Lipid Panel: No results found for: LDLCALC HgbA1c: No results found for: HGBA1C Urine Drug Screen: No results found for: LABOPIA, COCAINSCRNUR, LABBENZ, AMPHETMU, THCU, LABBARB  Alcohol Level No results found for: ETH   Impression   This is a 56 yo woman with hx celiac disease, psoriatic arthritis, pre-diabetes and HL who was BIB EMS for chest pressure, LLE weakness, L sided subjective weakness with sensory deficit to R face on exam, and L neck pain. CT head unremarkable. CTA showed no e/o LVO or dissection.   Recommendations   -  Recommend basic cardiac workup in ED to be f/b admission to hospitalist service for stroke/TIA workup as well as MRI c spine.  - Permissive HTN x48 hrs from sx onset or until stroke ruled out by MRI goal BP <220/110. PRN labetalol or hydralazine if BP above these parameters. Avoid oral antihypertensives. - MRI brain wo contrast - MRI c spine wwo contrast - TTE w/ bubble - Check A1c and LDL + add statin per guidelines - ASA 81mg  daily + plavix 75mg  daily x21 days f/b ASA 81mg  daily monotherapy after that - q4 hr neuro checks - STAT head CT for any change in neuro exam - Tele - PT/OT/SLP - Stroke education - Amb referral to neurology upon discharge  I will f/u above studies.  ______________________________________________________________________   Thank you for the opportunity to take part in the care of this patient. If you have any further questions, please contact the neurology consultation attending.  Signed,  Su Monks, MD [facility] [phone]  If 7pm- 7am, please page neurology on call as listed in [place].

## 2021-12-02 ENCOUNTER — Other Ambulatory Visit: Payer: Self-pay

## 2021-12-02 ENCOUNTER — Encounter: Payer: Self-pay | Admitting: Internal Medicine

## 2021-12-02 DIAGNOSIS — G459 Transient cerebral ischemic attack, unspecified: Secondary | ICD-10-CM

## 2021-12-02 DIAGNOSIS — E785 Hyperlipidemia, unspecified: Secondary | ICD-10-CM | POA: Diagnosis not present

## 2021-12-02 DIAGNOSIS — R42 Dizziness and giddiness: Secondary | ICD-10-CM | POA: Diagnosis not present

## 2021-12-02 LAB — GLUCOSE, CAPILLARY
Glucose-Capillary: 121 mg/dL — ABNORMAL HIGH (ref 70–99)
Glucose-Capillary: 105 mg/dL — ABNORMAL HIGH (ref 70–99)

## 2021-12-02 MED ORDER — MECLIZINE HCL 25 MG PO TABS: 25.0000 mg | ORAL_TABLET | Freq: Two times a day (BID) | ORAL | 0 refills | Status: AC | PRN

## 2021-12-02 MED ORDER — MECLIZINE HCL 25 MG PO TABS
25.0000 mg | ORAL_TABLET | Freq: Two times a day (BID) | ORAL | Status: DC
  Administered 2021-12-02: 12:00:00 25 mg via ORAL
  Filled 2021-12-02 (×3): qty 1

## 2021-12-02 MED ORDER — ATORVASTATIN CALCIUM 40 MG PO TABS: 40.0000 mg | ORAL_TABLET | Freq: Every day | ORAL | 0 refills | Status: AC

## 2021-12-02 MED ORDER — ASPIRIN 81 MG PO CHEW: 81.0000 mg | CHEWABLE_TABLET | Freq: Every day | ORAL | 0 refills | Status: AC

## 2021-12-02 NOTE — Care Management (Signed)
°  Transition of Care [facility]) Screening Note   Patient Details  Name: Linda Bennett Date of Birth: 02/28/1966   Transition of Care [facility]) CM/SW Contact:    Pete Pelt, RN Phone Number: 12/02/2021, 12:34 PM    Transition of Care Department [facility]) has reviewed patient and no TOC needs have been identified at this time. We will continue to monitor patient advancement through interdisciplinary progression rounds. If new patient transition needs arise, please place a TOC consult.

## 2021-12-02 NOTE — Progress Notes (Signed)
Chaplain On-Call responded to Spiritual Care Consult Order from Linda Bennett, [place].  Chaplain met the patient and her daughter Linda Bennett at bedside.  Chaplain provided supportive listening as they described the patient's experience of being admitted with a Stroke, and later discovering that a Stroke has been ruled out.  Patient discussed several other health concerns for herself, and her resolve to continue to seek wellness.  Chaplain provided spiritual and emotional support and prayer.  Chaplain [name] M.Div., [facility]

## 2021-12-02 NOTE — Evaluation (Signed)
Physical Therapy Evaluation Patient Details Name: Linda Bennett MRN: [id number] DOB: 1966/08/13 Today's Date: 12/02/2021  History of Present Illness  Pt is a 56 y.o. female who presents to the ED with c/o L-sided pain in neck/arm and stroke-like symptoms. MRI revealed no acute intracranial abnormalities. PmHx: HTN, GERD, Psoriatic Arthritis, Celiac Diseases, Pre-Diabetes.   Clinical Impression  Pt resting in bed upon PT entrance into room for evaluation today. PT A&Ox4 and no complaints of pain at rest. Prior to hospitalization Pt was independent w/ all ADLs and working as a Psychologist, counselling.   Pt was able to complete bed mobility, transfers, and gait w/ modified independence. She was able to ambulate ~211ft and was able to complete the DGI throughout. She presents w/ some gait deviations w/ head turns, step-pivot turn, and stepping over obstacles due to c/o LLE weakness and what she reports as "heaviness". Pt will benefit from continued skilled PT in order to increase LE strength, improve mobility/gait, and restore PLOF. Current discharge recommendation to Outpatient PT is appropriate due to the level of assistance required by the patient to ensure safety and improve overall function.      Recommendations for follow up therapy are one component of a multi-disciplinary discharge planning process, led by the attending physician.  Recommendations may be updated based on patient status, additional functional criteria and insurance authorization.  Follow Up Recommendations Outpatient PT    Assistance Recommended at Discharge PRN  Patient can return home with the following       Equipment Recommendations    Recommendations for Other Services       Functional Status Assessment Patient has had a recent decline in their functional status and demonstrates the ability to make significant improvements in function in a reasonable and predictable amount of time.     Precautions / Restrictions  Precautions Precautions: None Restrictions Weight Bearing Restrictions: No      Mobility  Bed Mobility Overal bed mobility: Modified Independent                  Transfers Overall transfer level: Modified independent                      Ambulation/Gait Ambulation/Gait assistance: Modified independent (Device/Increase time) Gait Distance (Feet): 200 Feet   Gait Pattern/deviations: Decreased step length - right, Step-through pattern, Decreased step length - left, Decreased stride length Gait velocity: decreased        Stairs            Wheelchair Mobility    Modified Rankin (Stroke Patients Only)       Balance Overall balance assessment: Modified Independent                               Standardized Balance Assessment Standardized Balance Assessment : Dynamic Gait Index   Dynamic Gait Index Level Surface: Normal Change in Gait Speed: Normal Gait with Horizontal Head Turns: Mild Impairment Gait with Vertical Head Turns: Mild Impairment Gait and Pivot Turn: Mild Impairment Step Over Obstacle: Mild Impairment Step Around Obstacles: Normal Steps: Mild Impairment Total Score: 19       Pertinent Vitals/Pain Pain Assessment Pain Assessment: No/denies pain    Home Living Family/patient expects to be discharged to:: Private residence Living Arrangements: Children Available Help at Discharge: Family;Friend(s);Available PRN/intermittently Type of Home: House Home Access: Stairs to enter   Entrance Stairs-Number of Steps: 2 STE Alternate Level Stairs-Number  of Steps: 12 Home Layout: Two level Home Equipment: None      Prior Function Prior Level of Function : Independent/Modified Independent                     Hand Dominance        Extremity/Trunk Assessment   Upper Extremity Assessment Upper Extremity Assessment: Overall WFL for tasks assessed    Lower Extremity Assessment Lower Extremity Assessment:  Overall WFL for tasks assessed       Communication      Cognition Arousal/Alertness: Awake/alert Behavior During Therapy: WFL for tasks assessed/performed Overall Cognitive Status: Within Functional Limits for tasks assessed                                          General Comments      Exercises     Assessment/Plan    PT Assessment Patient needs continued PT services  PT Problem List Decreased strength;Decreased mobility;Decreased range of motion;Decreased coordination;Decreased activity tolerance;Decreased balance       PT Treatment Interventions DME instruction;Therapeutic exercise;Gait training;Balance training;Stair training;Functional mobility training;Therapeutic activities;Neuromuscular re-education    PT Goals (Current goals can be found in the Care Plan section)  Acute Rehab PT Goals Patient Stated Goal: to get stronger/better to go home PT Goal Formulation: With patient Time For Goal Achievement: 12/16/21 Potential to Achieve Goals: Good    Frequency Min 2X/week     Co-evaluation               AM-PAC PT "6 Clicks" Mobility  Outcome Measure Help needed turning from your back to your side while in a flat bed without using bedrails?: None Help needed moving from lying on your back to sitting on the side of a flat bed without using bedrails?: None Help needed moving to and from a bed to a chair (including a wheelchair)?: None Help needed standing up from a chair using your arms (e.g., wheelchair or bedside chair)?: None Help needed to walk in hospital room?: None Help needed climbing 3-5 steps with a railing? : A Little 6 Click Score: 23    End of Session   Activity Tolerance: Patient tolerated treatment well Patient left: in chair;with chair alarm set;with call bell/phone within reach Nurse Communication: Mobility status PT Visit Diagnosis: Unsteadiness on feet (R26.81);Muscle weakness (generalized) (M62.81)    Time:  3244-0102 PT Time Calculation (min) (ACUTE ONLY): 22 min   Charges:             Jonnie Kind, SPT 12/02/2021, 12:07 PM

## 2021-12-02 NOTE — Plan of Care (Signed)
Neurology plan of care  MRI negative for acute infarct or other acute intracranial process. MRI c spine wwo contrast shows mild spondylosis with no cord abnl. TTE no intracardiac clot or other significant abnormalities, neg for PFO. No further inpatient neurologic workup indicated. Patient may be discharged on ASA 81mg  daily + atorvastatin 80mg  daily (LDL 191). I will arrange neuro f/u.  Neurology to sign off, but please re-engage if any additional questions arise.  Su Monks, MD [facility] [phone]  If 7pm- 7am, please page neurology on call as listed in [place].

## 2021-12-02 NOTE — Progress Notes (Signed)
OT Cancellation Note  Patient Details Name: Linda Bennett MRN: [id number] DOB: [birth year]   Cancelled Treatment:    Reason Eval/Treat Not Completed: OT screened, no needs identified, will sign off. Pt observed to be ambulating in room without assistance. She was in bathroom and had gathered all items for bathing and dressing. She reports being at her baseline of functional. Pt without need for skilled OT. OT to SIGN OFF at this time.   Darleen Crocker, [place], OTR/L , CBIS ascom [phone]  12/02/21, 10:24 AM

## 2021-12-02 NOTE — Discharge Summary (Signed)
Physician Discharge Summary  Linda Bennett URK:[id number] DOB: [birth year] DOA: 12/01/2021  PCP: [facility] [place] date: 12/01/2021 Discharge date: 12/02/2021  Admitted From: home  Disposition:  home  Recommendations for Outpatient Follow-up:  Follow up with PCP in 1-2 weeks F/u w/ neuro in 1-2 weeks   Home Health: no  Equipment/Devices:  Discharge Condition: stable  CODE STATUS: full  Diet recommendation: Heart Healthy  Brief/Interim Summary: HPI was taken from Dr. Blaine Hamper: Linda Bennett is a 56 y.o. female with medical history significant of hypertension, prediabetes, GERD, psoriatic arthritis, celiac disease, tobacco use, who presents with left-sided numbness and weakness, and chest pressure.   Patient was last known normal at about 4:15 this morning, then she developed chest pain which is located in the substernal area, 9 out of 10 in severity, pressure-like, radiating to the left neck. She states that her chest pain has spontaneously resolved, then she developed numbness and weakness in the left side of her body, from her left ear to left arm and left leg.  She had some difficulty speaking which has quickly resolved.  No vision loss.  She also complains of mild pain in the left arm and the left leg. She has nausea, no vomiting, diarrhea or abdominal pain.  No symptoms of UTI.  Patient has mild dry cough, no shortness of breath, fever or chills.  No symptoms of UTI.   Data Reviewed and ED Course: pt was found to have trop 7, WBC 11.3, INR 0.9, PTT 29, pending COVID PCR, electrolytes renal function okay, blood pressure 145/71, heart rate 45-50s, RR 20, oxygen saturation 98% on room air.  CT of head is negative for acute intracranial abnormalities.  CT angiogram of the head and neck is negative for LVO, CTA of the brain perfusion is negative.  Chest x-ray negative.  Patient is placed on telemetry bed for observation.  Dr. Quinn Axe of neurology is consulted   As per Dr.  Jimmye Norman 12/02/21: Pt's CVA work up was neg. CT head, CTA head, MRI brain were all neg. CT C-spine showed mild cervical spondylosis w/o significant stenosis. Pt will continue on aspirin, statin at d/c as per neuro. Pt will need to f/u w/ neuro in 1-2 weeks. Pt verbalized her understanding. Of note, pt also c/o dizziness and felt like the room was spinning around her. Pt was started on meclizine.   Discharge Diagnoses:  Principal Problem:   Stroke-like symptom Active Problems:   Chest pain   HLD (hyperlipidemia)   Tobacco use   GERD (gastroesophageal reflux disease)   Sinus bradycardia  Possible TIA: CT head negative. CT angiogram negative.  MRI brain shows no acute intracranial abnormalities. MRI C-spine shows mild cervical spondylosis w/o significant stenosis. Continue on aspirin, plavix, statin. Neuro recs appreciated   Dizziness: felt like the room was spinning around her. Possible BPPV. Started on meclizine    Chest pain: troponin neg x 3. Continue on aspirin, plavix & statin. Nitro prn. Resolved    HLD: continue on statin    Tobacco use: nicotine patch to prevent w/drawl. Received smoking cessation counseling    GERD: continue on PPI    Sinus bradycardia: asymptomatic. Continue on tele   Obese: BMI 37.6. Would benefit from weight loss   Discharge Instructions  Discharge Instructions     Ambulatory referral to Neurology   Complete by: As directed    An appointment is requested in approximately: 6 wks   Diet - low sodium heart healthy  Complete by: As directed    Discharge instructions   Complete by: As directed    F/u w/ PCP in 1-2 weeks. F/u neuro in 1-2 weeks   Increase activity slowly   Complete by: As directed       Allergies as of 12/02/2021       Reactions   Hydrocodone Itching, Hives   Other reaction(s): Hives Other reaction(s): Hives   Gluten Meal Other (See Comments)   Celiac disease Other reaction(s): Other (See Comments) Celiac disease Other  reaction(s): has celiac Celiac disease   Oxycodone Itching   Tramadol Itching   Shrimp Extract Allergy Skin Test Itching        Medication List     TAKE these medications    aspirin 81 MG chewable tablet Chew 1 tablet (81 mg total) by mouth daily. Start taking on: December 03, 2021   atorvastatin 40 MG tablet Commonly known as: LIPITOR Take 1 tablet (40 mg total) by mouth daily. Start taking on: December 03, 2021   benzonatate 100 MG capsule Commonly known as: Best boy Take 1 capsule (100 mg total) by mouth 3 (three) times daily as needed for cough.   EPINEPHrine 0.3 mg/0.3 mL Soaj injection Commonly known as: EpiPen 2-Pak Inject 0.3 mLs (0.3 mg total) into the muscle as needed for anaphylaxis.   erythromycin ophthalmic ointment Place 1 application into the left eye 4 (four) times daily.   meclizine 25 MG tablet Commonly known as: ANTIVERT Take 1 tablet (25 mg total) by mouth 2 (two) times daily as needed for dizziness.   multivitamin tablet Take 1 tablet by mouth daily.   pantoprazole 40 MG tablet Commonly known as: PROTONIX Take 40 mg by mouth daily.   polyethylene glycol 17 g packet Commonly known as: MIRALAX / GLYCOLAX DISSOLVE 1 PACKET IN LIQUID & DRINK ONCE DAILY   vitamin B-12 1000 MCG tablet Commonly known as: CYANOCOBALAMIN Take 1,000 mcg by mouth daily.        Allergies  Allergen Reactions   Hydrocodone Itching and Hives    Other reaction(s): Hives Other reaction(s): Hives    Gluten Meal Other (See Comments)    Celiac disease Other reaction(s): Other (See Comments) Celiac disease Other reaction(s): has celiac Celiac disease    Oxycodone Itching   Tramadol Itching   Shrimp Extract Allergy Skin Test Itching    Consultations: neuro   Procedures/Studies: MR BRAIN WO CONTRAST  Result Date: 12/01/2021 CLINICAL DATA:  Neuro deficit, acute, stroke suspected. Headache and left neck pain. Right facial sensory deficit. Left lower  extremity drift. EXAM: MRI HEAD WITHOUT CONTRAST TECHNIQUE: Multiplanar, multiecho pulse sequences of the brain and surrounding structures were obtained without intravenous contrast. COMPARISON:  Head CT and CTA 12/01/2021 FINDINGS: Brain: There is no evidence of an acute infarct, intracranial hemorrhage, mass, midline shift, or extra-axial fluid collection. The ventricles and sulci are normal. Mild T2 hyperintensities in the cerebral white matter are most notable in the left periatrial region and are nonspecific but compatible with chronic small vessel ischemic disease. Vascular: Major intracranial vascular flow voids are preserved. Skull and upper cervical spine: Unremarkable bone marrow signal. Sinuses/Orbits: Unremarkable orbits. Paranasal sinuses and mastoid air cells are clear. Other: Presumed postoperative changes in the posterior scalp. IMPRESSION: 1. No acute intracranial abnormality. 2. Mild chronic small vessel ischemic disease. Electronically Signed   By: Logan Bores M.D.   On: 12/01/2021 18:30   MR CERVICAL SPINE W WO CONTRAST  Result Date: 12/01/2021 CLINICAL DATA:  Neck pain, acute, prior cervical surgery. Myelopathy, acute, cervical spine. Headache and left neck pain. Right facial sensory deficit. Left lower extremity drift. EXAM: MRI CERVICAL SPINE WITHOUT AND WITH CONTRAST TECHNIQUE: Multiplanar and multiecho pulse sequences of the cervical spine, to include the craniocervical junction and cervicothoracic junction, were obtained without and with intravenous contrast. CONTRAST:  26mL GADAVIST GADOBUTROL 1 MMOL/ML IV SOLN COMPARISON:  None. FINDINGS: Alignment: Normal. Vertebrae: No fracture, suspicious marrow lesion, or significant marrow edema. Cord: Normal cord signal and morphology. No abnormal intradural enhancement. Posterior Fossa, vertebral arteries, paraspinal tissues: Unremarkable. Disc levels: Preserved disc space heights. C2-3: Tiny central disc protrusion without significant  stenosis. C3-4: Minimal facet arthrosis without significant stenosis. C4-5: Mild disc bulging, mild uncovertebral spurring, and mild facet arthrosis without significant stenosis. C5-6: Mild disc bulging and facet arthrosis without significant stenosis. C6-7: Negative. C7-T1: Negative. IMPRESSION: 1. Mild cervical spondylosis without significant stenosis. 2. Normal appearance of the cervical spinal cord. Electronically Signed   By: Logan Bores M.D.   On: 12/01/2021 18:35   DG Chest Port 1 View  Result Date: 12/01/2021 CLINICAL DATA:  Altered mental status, chest pain EXAM: PORTABLE CHEST 1 VIEW COMPARISON:  03/24/2021 FINDINGS: The heart size and mediastinal contours are within normal limits. Both lungs are clear. The visualized skeletal structures are unremarkable. IMPRESSION: No active disease. Electronically Signed   By: Elmer Picker M.D.   On: 12/01/2021 14:52   ECHOCARDIOGRAM COMPLETE BUBBLE STUDY  Result Date: 12/01/2021    ECHOCARDIOGRAM REPORT   Patient Name:   Linda Bennett Date of Exam: 12/01/2021 Medical Rec #:  [id number]     Height:       67.0 in Accession #:    [id number]    Weight:       235.0 lb Date of Birth:  Jul 30, 1966     BSA:          2.166 m Patient Age:    60 years      BP:           141/70 mmHg Patient Gender: F             HR:           50 bpm. Exam Location:  [facility] Procedure: 2D Echo, Cardiac Doppler, Color Doppler and Saline Contrast Bubble            Study Indications:     Stroke 434.91 / I63.9  History:         Patient has no prior history of Echocardiogram examinations.                  Stroke; Signs/Symptoms:Chest Pain. Tobacco use.  Sonographer:     Sherrie Sport Referring Phys:  [place] Diagnosing Phys: Kathlyn Sacramento MD IMPRESSIONS  1. Left ventricular ejection fraction, by estimation, is 55 to 60%. The left ventricle has normal function. The left ventricle has no regional wall motion abnormalities. Left ventricular diastolic parameters were normal.  2. Right  ventricular systolic function is normal. The right ventricular size is normal. Tricuspid regurgitation signal is inadequate for assessing PA pressure.  3. Left atrial size was mildly dilated.  4. The mitral valve is normal in structure. No evidence of mitral valve regurgitation. No evidence of mitral stenosis.  5. The aortic valve is normal in structure. Aortic valve regurgitation is not visualized. No aortic stenosis is present.  6. Agitated saline contrast bubble study was negative, with no evidence of any interatrial shunt. Conclusion(s)/Recommendation(s):  No intracardiac source of embolism detected on this transthoracic study. Consider a transesophageal echocardiogram to exclude cardiac source of embolism if clinically indicated. FINDINGS  Left Ventricle: Left ventricular ejection fraction, by estimation, is 55 to 60%. The left ventricle has normal function. The left ventricle has no regional wall motion abnormalities. The left ventricular internal cavity size was normal in size. There is  no left ventricular hypertrophy. Left ventricular diastolic parameters were normal. Right Ventricle: The right ventricular size is normal. No increase in right ventricular wall thickness. Right ventricular systolic function is normal. Tricuspid regurgitation signal is inadequate for assessing PA pressure. Left Atrium: Left atrial size was mildly dilated. Right Atrium: Right atrial size was normal in size. Pericardium: There is no evidence of pericardial effusion. Mitral Valve: The mitral valve is normal in structure. No evidence of mitral valve regurgitation. No evidence of mitral valve stenosis. MV peak gradient, 2.4 mmHg. The mean mitral valve gradient is 1.0 mmHg. Tricuspid Valve: The tricuspid valve is normal in structure. Tricuspid valve regurgitation is not demonstrated. No evidence of tricuspid stenosis. Aortic Valve: The aortic valve is normal in structure. Aortic valve regurgitation is not visualized. No aortic  stenosis is present. Aortic valve mean gradient measures 2.5 mmHg. Aortic valve peak gradient measures 5.0 mmHg. Aortic valve area, by VTI measures 2.48 cm. Pulmonic Valve: The pulmonic valve was normal in structure. Pulmonic valve regurgitation is not visualized. No evidence of pulmonic stenosis. Aorta: The aortic root is normal in size and structure. Venous: The inferior vena cava was not well visualized. IAS/Shunts: No atrial level shunt detected by color flow Doppler. Agitated saline contrast was given intravenously to evaluate for intracardiac shunting. Agitated saline contrast bubble study was negative, with no evidence of any interatrial shunt.  LEFT VENTRICLE PLAX 2D LVIDd:         4.40 cm   Diastology LVIDs:         3.00 cm   LV e' medial:    5.87 cm/s LV PW:         1.40 cm   LV E/e' medial:  9.6 LV IVS:        1.10 cm   LV e' lateral:   8.81 cm/s LVOT diam:     2.00 cm   LV E/e' lateral: 6.4 LV SV:         54 LV SV Index:   25 LVOT Area:     3.14 cm  RIGHT VENTRICLE RV Basal diam:  2.80 cm RV S prime:     10.60 cm/s TAPSE (M-mode): 1.3 cm LEFT ATRIUM             Index        RIGHT ATRIUM           Index LA diam:        3.30 cm 1.52 cm/m   RA Area:     17.60 cm LA Vol (A2C):   46.0 ml 21.24 ml/m  RA Volume:   52.20 ml  24.10 ml/m LA Vol (A4C):   62.3 ml 28.77 ml/m LA Biplane Vol: 56.3 ml 26.00 ml/m  AORTIC VALVE                    PULMONIC VALVE AV Area (Vmax):    1.87 cm     PV Vmax:        0.61 m/s AV Area (Vmean):   1.98 cm     PV Vmean:  45.100 cm/s AV Area (VTI):     2.48 cm     PV VTI:         0.124 m AV Vmax:           112.00 cm/s  PV Peak grad:   1.5 mmHg AV Vmean:          73.000 cm/s  PV Mean grad:   1.0 mmHg AV VTI:            0.217 m      RVOT Peak grad: 4 mmHg AV Peak Grad:      5.0 mmHg AV Mean Grad:      2.5 mmHg LVOT Vmax:         66.60 cm/s LVOT Vmean:        46.100 cm/s LVOT VTI:          0.171 m LVOT/AV VTI ratio: 0.79  AORTA Ao Root diam: 3.30 cm MITRAL VALVE                TRICUSPID VALVE MV Area (PHT): 3.60 cm    TR Peak grad:   8.2 mmHg MV Area VTI:   1.83 cm    TR Vmax:        143.00 cm/s MV Peak grad:  2.4 mmHg MV Mean grad:  1.0 mmHg    SHUNTS MV Vmax:       0.78 m/s    Systemic VTI:  0.17 m MV Vmean:      41.6 cm/s   Systemic Diam: 2.00 cm MV Decel Time: 211 msec    Pulmonic VTI:  0.218 m MV E velocity: 56.10 cm/s MV A velocity: 74.10 cm/s MV E/A ratio:  0.76 Kathlyn Sacramento MD Electronically signed by Kathlyn Sacramento MD Signature Date/Time: 12/01/2021/3:07:40 PM    Final    CT HEAD CODE STROKE WO CONTRAST  Result Date: 12/01/2021 CLINICAL DATA:  Code stroke. Neuro deficit, acute, stroke suspected, left sided deficit since 4am EXAM: CT ANGIOGRAPHY HEAD AND NECK CT PERFUSION BRAIN TECHNIQUE: Multidetector CT imaging of the head and neck was performed using the standard protocol during bolus administration of intravenous contrast. Multiplanar CT image reconstructions and MIPs were obtained to evaluate the vascular anatomy. Carotid stenosis measurements (when applicable) are obtained utilizing NASCET criteria, using the distal internal carotid diameter as the denominator. Multiphase CT imaging of the brain was performed following IV bolus contrast injection. Subsequent parametric perfusion maps were calculated using RAPID software. RADIATION DOSE REDUCTION: This exam was performed according to the departmental dose-optimization program which includes automated exposure control, adjustment of the mA and/or kV according to patient size and/or use of iterative reconstruction technique. CONTRAST:  164mL OMNIPAQUE IOHEXOL 350 MG/ML SOLN COMPARISON:  None. FINDINGS: CT HEAD Brain: There is no acute intracranial hemorrhage, mass effect, or edema. Gray-white differentiation is preserved. There is no extra-axial fluid collection. Ventricles and sulci are within normal limits in size and configuration. Vascular: No hyperdense vessel or unexpected calcification. Skull: Calvarium is  unremarkable. Sinuses/Orbits: No acute finding. Other: None. Review of the MIP images confirms the above findings CTA NECK Aortic arch: Great vessel origins are patent. Right carotid system: Patent. No stenosis. Left carotid system: Patent. No stenosis. Vertebral arteries: Patent. Right vertebral is dominant. No stenosis. Skeleton: Mild cervical spine degenerative changes. Other neck: Unremarkable. Upper chest: Included upper lungs are clear. Review of the MIP images confirms the above findings CTA HEAD Anterior circulation: Intracranial internal carotid arteries are patent. Anterior and middle cerebral arteries  are patent. Anterior communicating artery is present. Posterior circulation: Intracranial vertebral arteries are patent. Basilar artery is patent. Major cerebellar artery origins are patent. Bilateral posterior communicating arteries are present. Posterior cerebral arteries are patent. Venous sinuses: Patent as allowed by contrast bolus timing. Review of the MIP images confirms the above findings CT Brain Perfusion Findings: ASPECTS: 10 CBF (<30%) Volume: 32mL Perfusion (Tmax>6.0s) volume: 47mL Mismatch Volume: 14mL Infarction Location: None. IMPRESSION: There is no acute intracranial hemorrhage or evidence of acute infarction. ASPECT score is 10. No large vessel occlusion, hemodynamically significant stenosis, or evidence of dissection. Perfusion imaging demonstrates no evidence of core infarction or penumbra. Preliminary results were communicated to Dr. Quinn Axe at 11:17 am on 12/01/2021 by text page via the [facility] messaging system. Electronically Signed   By: Macy Mis M.D.   On: 12/01/2021 11:22   CT ANGIO HEAD NECK W WO CM W PERF (CODE STROKE)  Result Date: 12/01/2021 CLINICAL DATA:  Code stroke. Neuro deficit, acute, stroke suspected, left sided deficit since 4am EXAM: CT ANGIOGRAPHY HEAD AND NECK CT PERFUSION BRAIN TECHNIQUE: Multidetector CT imaging of the head and neck was performed using the  standard protocol during bolus administration of intravenous contrast. Multiplanar CT image reconstructions and MIPs were obtained to evaluate the vascular anatomy. Carotid stenosis measurements (when applicable) are obtained utilizing NASCET criteria, using the distal internal carotid diameter as the denominator. Multiphase CT imaging of the brain was performed following IV bolus contrast injection. Subsequent parametric perfusion maps were calculated using RAPID software. RADIATION DOSE REDUCTION: This exam was performed according to the departmental dose-optimization program which includes automated exposure control, adjustment of the mA and/or kV according to patient size and/or use of iterative reconstruction technique. CONTRAST:  147mL OMNIPAQUE IOHEXOL 350 MG/ML SOLN COMPARISON:  None. FINDINGS: CT HEAD Brain: There is no acute intracranial hemorrhage, mass effect, or edema. Gray-white differentiation is preserved. There is no extra-axial fluid collection. Ventricles and sulci are within normal limits in size and configuration. Vascular: No hyperdense vessel or unexpected calcification. Skull: Calvarium is unremarkable. Sinuses/Orbits: No acute finding. Other: None. Review of the MIP images confirms the above findings CTA NECK Aortic arch: Great vessel origins are patent. Right carotid system: Patent. No stenosis. Left carotid system: Patent. No stenosis. Vertebral arteries: Patent. Right vertebral is dominant. No stenosis. Skeleton: Mild cervical spine degenerative changes. Other neck: Unremarkable. Upper chest: Included upper lungs are clear. Review of the MIP images confirms the above findings CTA HEAD Anterior circulation: Intracranial internal carotid arteries are patent. Anterior and middle cerebral arteries are patent. Anterior communicating artery is present. Posterior circulation: Intracranial vertebral arteries are patent. Basilar artery is patent. Major cerebellar artery origins are patent.  Bilateral posterior communicating arteries are present. Posterior cerebral arteries are patent. Venous sinuses: Patent as allowed by contrast bolus timing. Review of the MIP images confirms the above findings CT Brain Perfusion Findings: ASPECTS: 10 CBF (<30%) Volume: 28mL Perfusion (Tmax>6.0s) volume: 56mL Mismatch Volume: 84mL Infarction Location: None. IMPRESSION: There is no acute intracranial hemorrhage or evidence of acute infarction. ASPECT score is 10. No large vessel occlusion, hemodynamically significant stenosis, or evidence of dissection. Perfusion imaging demonstrates no evidence of core infarction or penumbra. Preliminary results were communicated to Dr. Quinn Axe at 11:17 am on 12/01/2021 by text page via the [facility] messaging system. Electronically Signed   By: Macy Mis M.D.   On: 12/01/2021 11:22   (Echo, Carotid, EGD, Colonoscopy, ERCP)    Subjective: Pt c/o fatigue    Discharge  Exam: Vitals:   12/02/21 0504 12/02/21 0748  BP: 125/67 129/76  Pulse: (!) 51 (!) 54  Resp: 16 15  Temp: 97.7 F (36.5 C) 98 F (36.7 C)  SpO2: 99% 100%   Vitals:   12/01/21 2055 12/02/21 0028 12/02/21 0504 12/02/21 0748  BP: 131/67 125/66 125/67 129/76  Pulse: (!) 53 (!) 51 (!) 51 (!) 54  Resp:  16 16 15   Temp: 98 F (36.7 C) 97.9 F (36.6 C) 97.7 F (36.5 C) 98 F (36.7 C)  TempSrc:  Oral  Oral  SpO2: 98% 100% 99% 100%  Weight:      Height:  5\' 7"  (1.702 m)      General: Pt is alert, awake, not in acute distress Cardiovascular: S1/S2 +, no rubs, no gallops Respiratory: CTA bilaterally, no wheezing, no rhonchi Abdominal: Soft, NT, obese, bowel sounds + Extremities: no cyanosis    The results of significant diagnostics from this hospitalization (including imaging, microbiology, ancillary and laboratory) are listed below for reference.     Microbiology: No results found for this or any previous visit (from the past 240 hour(s)).   Labs: BNP (last 3 results) Recent Labs     03/24/21 1350  BNP 947.0*   Basic Metabolic Panel: Recent Labs  Lab 12/01/21 1054  NA 137  K 4.0  CL 105  CO2 26  GLUCOSE 111*  BUN 12  CREATININE 0.91  CALCIUM 9.3   Liver Function Tests: Recent Labs  Lab 12/01/21 1054  AST 29  ALT 25  ALKPHOS 80  BILITOT 0.8  PROT 7.4  ALBUMIN 3.7   No results for input(s): LIPASE, AMYLASE in the last 168 hours. No results for input(s): AMMONIA in the last 168 hours. CBC: Recent Labs  Lab 12/01/21 1054  WBC 11.3*  NEUTROABS 5.3  HGB 12.3  HCT 39.6  MCV 84.4  PLT 265   Cardiac Enzymes: No results for input(s): CKTOTAL, CKMB, CKMBINDEX, TROPONINI in the last 168 hours. BNP: Invalid input(s): POCBNP CBG: Recent Labs  Lab 12/01/21 1049 12/01/21 2133 12/02/21 0804 12/02/21 1142  GLUCAP 123* 128* 121* 105*   D-Dimer No results for input(s): DDIMER in the last 72 hours. Hgb A1c Recent Labs    12/01/21 1054  HGBA1C 6.9*   Lipid Profile Recent Labs    12/01/21 1054  LDLDIRECT 199.2*   Thyroid function studies No results for input(s): TSH, T4TOTAL, T3FREE, THYROIDAB in the last 72 hours.  Invalid input(s): FREET3 Anemia work up No results for input(s): VITAMINB12, FOLATE, FERRITIN, TIBC, IRON, RETICCTPCT in the last 72 hours. Urinalysis    Component Value Date/Time   COLORURINE YELLOW (A) 09/16/2019 2014   APPEARANCEUR HAZY (A) 09/16/2019 2014   APPEARANCEUR Clear 05/15/2014 1528   LABSPEC 1.011 09/16/2019 2014   LABSPEC 1.004 05/15/2014 1528   PHURINE 7.0 09/16/2019 2014   GLUCOSEU NEGATIVE 09/16/2019 2014   GLUCOSEU Negative 05/15/2014 1528   [place] NEGATIVE 09/16/2019 2014   [place] NEGATIVE 09/16/2019 2014   BILIRUBINUR Negative 05/15/2014 [place] 09/16/2019 2014   PROTEINUR NEGATIVE 09/16/2019 2014   NITRITE NEGATIVE 09/16/2019 2014   LEUKOCYTESUR NEGATIVE 09/16/2019 2014   LEUKOCYTESUR Negative 05/15/2014 1528   Sepsis Labs Invalid input(s): PROCALCITONIN,  WBC,   LACTICIDVEN Microbiology No results found for this or any previous visit (from the past 240 hour(s)).   Time coordinating discharge: Over 30 minutes  SIGNED:   Wyvonnia Dusky, MD  [facility] 12/02/2021, 12:25 PM Pager   If  7PM-7AM, please contact night-coverage

## 2021-12-30 ENCOUNTER — Ambulatory Visit: Payer: Medicaid Other | Admitting: Neurology

## 2021-12-31 ENCOUNTER — Emergency Department
Admission: EM | Admit: 2021-12-31 | Discharge: 2021-12-31 | Disposition: A | Discharge status: Home / Self Care | Payer: Medicaid Other | Attending: Emergency Medicine | Admitting: Emergency Medicine

## 2021-12-31 ENCOUNTER — Encounter: Payer: Self-pay | Admitting: Emergency Medicine

## 2021-12-31 ENCOUNTER — Other Ambulatory Visit: Payer: Self-pay

## 2021-12-31 ENCOUNTER — Emergency Department: Payer: Medicaid Other

## 2021-12-31 DIAGNOSIS — K5900 Constipation, unspecified: Secondary | ICD-10-CM | POA: Diagnosis not present

## 2021-12-31 DIAGNOSIS — Z7982 Long term (current) use of aspirin: Secondary | ICD-10-CM | POA: Insufficient documentation

## 2021-12-31 DIAGNOSIS — R0789 Other chest pain: Secondary | ICD-10-CM | POA: Insufficient documentation

## 2021-12-31 DIAGNOSIS — R109 Unspecified abdominal pain: Secondary | ICD-10-CM | POA: Insufficient documentation

## 2021-12-31 DIAGNOSIS — R079 Chest pain, unspecified: Secondary | ICD-10-CM

## 2021-12-31 LAB — CBC
HCT: 36.5 % (ref 36.0–46.0)
Hemoglobin: 11.3 g/dL — ABNORMAL LOW (ref 12.0–15.0)
MCH: 25.6 pg — ABNORMAL LOW (ref 26.0–34.0)
MCHC: 31 g/dL (ref 30.0–36.0)
MCV: 82.6 fL (ref 80.0–100.0)
Platelets: 295 10*3/uL (ref 150–400)
RBC: 4.42 MIL/uL (ref 3.87–5.11)
RDW: 12.2 % (ref 11.5–15.5)
WBC: 13.5 10*3/uL — ABNORMAL HIGH (ref 4.0–10.5)
nRBC: 0 % (ref 0.0–0.2)

## 2021-12-31 LAB — URINALYSIS, ROUTINE W REFLEX MICROSCOPIC
Bilirubin Urine: NEGATIVE
Glucose, UA: NEGATIVE mg/dL
Hgb urine dipstick: NEGATIVE
Ketones, ur: NEGATIVE mg/dL
Leukocytes,Ua: NEGATIVE
Nitrite: NEGATIVE
Protein, ur: NEGATIVE mg/dL
Specific Gravity, Urine: 1.01 (ref 1.005–1.030)
pH: 6 (ref 5.0–8.0)

## 2021-12-31 LAB — BASIC METABOLIC PANEL
Anion gap: 8 (ref 5–15)
BUN: 13 mg/dL (ref 6–20)
CO2: 23 mmol/L (ref 22–32)
Calcium: 8.8 mg/dL — ABNORMAL LOW (ref 8.9–10.3)
Chloride: 105 mmol/L (ref 98–111)
Creatinine, Ser: 0.89 mg/dL (ref 0.44–1.00)
GFR, Estimated: >60 mL/min (ref >60)
Glucose, Bld: 137 mg/dL — ABNORMAL HIGH (ref 70–99)
Potassium: 4.1 mmol/L (ref 3.5–5.1)
Sodium: 136 mmol/L (ref 135–145)

## 2021-12-31 LAB — HEPATIC FUNCTION PANEL
ALT: 16 U/L (ref 0–44)
AST: 27 U/L (ref 15–41)
Albumin: 3.6 g/dL (ref 3.5–5.0)
Alkaline Phosphatase: 77 U/L (ref 38–126)
Bilirubin, Direct: 0.3 mg/dL — ABNORMAL HIGH (ref 0.0–0.2)
Indirect Bilirubin: 0.9 mg/dL (ref 0.3–0.9)
Total Bilirubin: 1.2 mg/dL (ref 0.3–1.2)
Total Protein: 7.5 g/dL (ref 6.5–8.1)

## 2021-12-31 LAB — TROPONIN I (HIGH SENSITIVITY): Troponin I (High Sensitivity): 7 ng/L (ref <18)

## 2021-12-31 LAB — LIPASE, BLOOD: Lipase: 36 U/L (ref 11–51)

## 2021-12-31 MED ORDER — FAMOTIDINE IN NACL 20-0.9 MG/50ML-% IV SOLN
20.0000 mg | Freq: Once | INTRAVENOUS | Status: AC
  Administered 2021-12-31: 20 mg via INTRAVENOUS
  Filled 2021-12-31: qty 50

## 2021-12-31 MED ORDER — LACTULOSE 10 GM/15ML PO SOLN: 20.0000 g | Freq: Every day | ORAL | 0 refills | Status: AC | PRN

## 2021-12-31 MED ORDER — OXYCODONE-ACETAMINOPHEN 5-325 MG PO TABS: 1.0000 | ORAL_TABLET | ORAL | 0 refills | Status: AC | PRN

## 2021-12-31 MED ORDER — KETOROLAC TROMETHAMINE 30 MG/ML IJ SOLN
10.0000 mg | Freq: Once | INTRAMUSCULAR | Status: AC
  Administered 2021-12-31: 9.9 mg via INTRAVENOUS
  Filled 2021-12-31: qty 1

## 2021-12-31 MED ORDER — IOHEXOL 350 MG/ML SOLN
80.0000 mL | Freq: Once | INTRAVENOUS | Status: AC | PRN
  Administered 2021-12-31: 80 mL via INTRAVENOUS

## 2021-12-31 MED ORDER — SODIUM CHLORIDE 0.9 % IV BOLUS
1000.0000 mL | Freq: Once | INTRAVENOUS | Status: AC
  Administered 2021-12-31: 1000 mL via INTRAVENOUS

## 2021-12-31 MED ORDER — OXYCODONE-ACETAMINOPHEN 5-325 MG PO TABS
1.0000 | ORAL_TABLET | Freq: Once | ORAL | Status: AC
  Administered 2021-12-31: 1 via ORAL
  Filled 2021-12-31: qty 1

## 2021-12-31 MED ORDER — IBUPROFEN 800 MG PO TABS: 800.0000 mg | ORAL_TABLET | Freq: Three times a day (TID) | ORAL | 0 refills | Status: AC | PRN

## 2021-12-31 MED ORDER — DIPHENHYDRAMINE HCL 25 MG PO CAPS
25.0000 mg | ORAL_CAPSULE | Freq: Once | ORAL | Status: AC
  Administered 2021-12-31: 25 mg via ORAL
  Filled 2021-12-31: qty 1

## 2021-12-31 NOTE — ED Triage Notes (Signed)
EMS brings pt in from home; since Saturday having pain to left side/flank; pain resolved but returned tonight ?

## 2021-12-31 NOTE — Discharge Instructions (Addendum)
1.  Take Lactulose as needed for bowel movements. ?2.  You may take pain medicines as needed (Motrin/Percocet).  Take Benadryl when you take Percocet to combat the itching. ?3.  Return to the ER for worsening symptoms, persistent vomiting, difficulty breathing or other concerns. ?

## 2021-12-31 NOTE — ED Provider Notes (Signed)
? ?[facility] ?Provider Note ? ? ? Event Date/Time  ? First MD Initiated Contact with Patient 12/31/21 0240   ?  (approximate) ? ? ?History  ? ?Flank Pain ? ? ?HPI ? ?Linda Bennett is a 56 y.o. female brought to the ED via EMS from home with a chief complaint of left chest/flank pain x3 days.  Patient complains of sharp pain underneath her right breast radiating into her back.  No relief with OTC medications.  Denies trauma or injury.  Pain exacerbated by deep inspiration.  Denies fever, cough, abdominal pain, nausea, vomiting, dysuria or dizziness.  Denies hormone use or recent travel. ?  ? ? ?Past Medical History  ? ?Past Medical History:  ?Diagnosis Date  ?? Celiac disease   ?? Hidradenitis suppurativa   ?? High cholesterol   ?? Pre-diabetes   ?? Psoriasis   ?? Psoriatic arthritis ([place])   ?? Vitamin D deficiency   ? ? ? ?Active Problem List  ? ?Patient Active Problem List  ? Diagnosis Date Noted  ?? Stroke-like symptom 12/01/2021  ?? Chest pain 12/01/2021  ?? HLD (hyperlipidemia) 12/01/2021  ?? Tobacco use 12/01/2021  ?? GERD (gastroesophageal reflux disease) 12/01/2021  ?? Sinus bradycardia 12/01/2021  ?? Cholecystitis 02/25/2019  ? ? ? ?Past Surgical History  ? ?Past Surgical History:  ?Procedure Laterality Date  ?? BACK SURGERY    ?? CHOLECYSTECTOMY N/A 02/25/2019  ? Procedure: LAPAROSCOPIC CHOLECYSTECTOMY;  Surgeon: Jules Husbands, MD;  Location: [facility] ORS;  Service: General;  Laterality: N/A;  ?? LIVER BIOPSY    ?? SHOULDER SURGERY    ?? TONSILLECTOMY    ?? TUBAL LIGATION    ? ? ? ?Home Medications  ? ?Prior to Admission medications   ?Medication Sig Start Date End Date Taking? Authorizing Provider  ?ibuprofen (ADVIL) 800 MG tablet Take 1 tablet (800 mg total) by mouth every 8 (eight) hours as needed for moderate pain. 12/31/21  Yes Paulette Blanch, MD  ?lactulose (CHRONULAC) 10 GM/15ML solution Take 30 mLs (20 g total) by mouth daily as needed for mild constipation. 12/31/21  Yes Paulette Blanch, MD  ?oxyCODONE-acetaminophen (PERCOCET/ROXICET) 5-325 MG tablet Take 1 tablet by mouth every 4 (four) hours as needed for severe pain. 12/31/21  Yes Paulette Blanch, MD  ?aspirin 81 MG chewable tablet Chew 1 tablet (81 mg total) by mouth daily. 12/03/21 01/02/22  Wyvonnia Dusky, MD  ?atorvastatin (LIPITOR) 40 MG tablet Take 1 tablet (40 mg total) by mouth daily. 12/03/21 01/02/22  Wyvonnia Dusky, MD  ?benzonatate (TESSALON PERLES) 100 MG capsule Take 1 capsule (100 mg total) by mouth 3 (three) times daily as needed for cough. ?Patient not taking: Reported on 11/04/2021 03/24/21 03/24/22  Marlana Salvage, PA  ?EPINEPHrine (EPIPEN 2-PAK) 0.3 mg/0.3 mL IJ SOAJ injection Inject 0.3 mLs (0.3 mg total) into the muscle as needed for anaphylaxis. ?Patient not taking: Reported on 12/01/2021 10/15/19   Arta Silence, MD  ?erythromycin ophthalmic ointment Place 1 application into the left eye 4 (four) times daily. ?Patient not taking: Reported on 11/04/2021 03/24/21   Marlana Salvage, PA  ?meclizine (ANTIVERT) 25 MG tablet Take 1 tablet (25 mg total) by mouth 2 (two) times daily as needed for dizziness. 12/02/21 01/01/22  Wyvonnia Dusky, MD  ?Multiple Vitamin (MULTIVITAMIN) tablet Take 1 tablet by mouth daily.    [provider]  ?pantoprazole (PROTONIX) 40 MG tablet Take 40 mg by mouth daily. ?Patient not taking: Reported  on 11/04/2021    [provider]  ?polyethylene glycol (MIRALAX / GLYCOLAX) 17 g packet DISSOLVE 1 PACKET IN LIQUID & DRINK ONCE DAILY ?Patient not taking: Reported on 11/04/2021 02/24/19   [provider]  ?vitamin B-12 (CYANOCOBALAMIN) 1000 MCG tablet Take 1,000 mcg by mouth daily.    [provider]  ? ? ? ?Allergies  ?Hydrocodone, Gluten meal, Oxycodone, Tramadol, and Shrimp extract allergy skin test ? ? ?Family History  ? ?Family History  ?Problem Relation Age of Onset  ?? Stroke Brother   ?? Prostate cancer Brother   ? ? ? ?Physical Exam  ?Triage Vital  Signs: ?ED Triage Vitals  ?Enc Vitals Group  ?   BP 12/31/21 0102 137/66  ?   Pulse Rate 12/31/21 0102 62  ?   Resp 12/31/21 0102 18  ?   Temp 12/31/21 0102 98.8 ?F (37.1 ?C)  ?   Temp Source 12/31/21 0102 Oral  ?   SpO2 12/31/21 0059 98 %  ?   Weight 12/31/21 0103 234 lb (106.1 kg)  ?   Height 12/31/21 0103 '5\' 7"'$  (1.702 m)  ?   Head Circumference --   ?   Peak Flow --   ?   Pain Score 12/31/21 0103 8  ?   Pain Loc --   ?   Pain Edu? --   ?   Excl. in [place]? --   ? ? ?Updated Vital Signs: ?BP (!) 123/57   Pulse 61   Temp 98.8 ?F (37.1 ?C) (Oral)   Resp 18   Ht '5\' 7"'$  (1.702 m)   Wt 106.1 kg   LMP 03/05/2016 (Exact Date)   SpO2 100%   BMI 36.65 kg/m?  ? ? ?General: Awake, no distress.  ?CV:  RRR.  Good peripheral perfusion.  ?Resp:  Normal effort.  CTA B. ?Abd:  Mildly tender to left upper quadrant without rebound or guarding.  No CVAT.  No distention.  ?Other:  No vesicles.  Calves are nontender and nonswollen. ? ? ?ED Results / Procedures / Treatments  ?Labs ?(all labs ordered are listed, but only abnormal results are displayed) ?Labs Reviewed  ?URINALYSIS, ROUTINE W REFLEX MICROSCOPIC - Abnormal; Notable for the following components:  ?    Result Value  ? Color, Urine YELLOW (*)   ? APPearance CLEAR (*)   ? All other components within normal limits  ?BASIC METABOLIC PANEL - Abnormal; Notable for the following components:  ? Glucose, Bld 137 (*)   ? Calcium 8.8 (*)   ? All other components within normal limits  ?CBC - Abnormal; Notable for the following components:  ? WBC 13.5 (*)   ? Hemoglobin 11.3 (*)   ? MCH 25.6 (*)   ? All other components within normal limits  ?HEPATIC FUNCTION PANEL - Abnormal; Notable for the following components:  ? Bilirubin, Direct 0.3 (*)   ? All other components within normal limits  ?URINE CULTURE  ?LIPASE, BLOOD  ?TROPONIN I (HIGH SENSITIVITY)  ? ? ? ?EKG ? ?ED ECG REPORT ?I, Paulette Blanch, the attending physician, personally viewed and interpreted this ECG. ? ? Date: 12/31/2021 ?  EKG Time: 0337 ? Rate: 57 ? Rhythm: sinus bradycardia ? Axis: Normal ? Intervals:none ? ST&T Change: Nonspecific ? ? ? ?RADIOLOGY ?I have independently visualized and reviewed patient's CTA chest, CT abdomen pelvis as well as reviewed the radiology interpretation: ? ?CTA chest: No PE ? ?CT abdomen pelvis: No acute intra-abdominal abnormality; moderate stool burden ? ?  Official radiology report(s): ?CT Angio Chest PE W/Cm &/Or Wo Cm ? ?Result Date: 12/31/2021 ?CLINICAL DATA:  Chest and left-sided flank pain, initial encounter EXAM: CT ANGIOGRAPHY CHEST CT ABDOMEN AND PELVIS WITH CONTRAST TECHNIQUE: Multidetector CT imaging of the chest was performed using the standard protocol during bolus administration of intravenous contrast. Multiplanar CT image reconstructions and MIPs were obtained to evaluate the vascular anatomy. Multidetector CT imaging of the abdomen and pelvis was performed using the standard protocol during bolus administration of intravenous contrast. RADIATION DOSE REDUCTION: This exam was performed according to the departmental dose-optimization program which includes automated exposure control, adjustment of the mA and/or kV according to patient size and/or use of iterative reconstruction technique. CONTRAST:  76m OMNIPAQUE IOHEXOL 350 MG/ML SOLN COMPARISON:  None. FINDINGS: CTA CHEST FINDINGS Cardiovascular: Thoracic aorta shows no aneurysmal dilatation or dissection. No cardiac enlargement is noted. No coronary calcifications are seen. The pulmonary artery shows a normal branching pattern bilaterally. No filling defect is noted to suggest pulmonary embolism. Mediastinum/Nodes: Thoracic inlet is within normal limits. No sizable hilar or mediastinal adenopathy is noted. The esophagus as visualized is within normal limits. Lungs/Pleura: Lungs are well aerated bilaterally. No focal infiltrate or sizable effusion is noted. Musculoskeletal: No chest wall abnormality. No acute or significant osseous  findings. Review of the MIP images confirms the above findings. CT ABDOMEN and PELVIS FINDINGS Hepatobiliary: No focal liver abnormality is seen. Status post cholecystectomy. No biliary dilatation. Pancreas: Unremarkable

## 2021-12-31 NOTE — ED Triage Notes (Signed)
Pt presents via EMS with complaints of left sided flank pain starting 3 days ago that has progressively gotten worse. She notes that the pain radiates from her side up to her shoulder. She has tried OTC Ibuprofen without relief. Denies urinary sx, CP, or SOB.  ?

## 2021-12-31 NOTE — ED Notes (Signed)
Patient transported to CT 

## 2022-01-01 LAB — URINE CULTURE: Culture: <10000 — AB

## 2022-04-28 ENCOUNTER — Other Ambulatory Visit: Payer: Self-pay | Admitting: Student

## 2022-04-28 DIAGNOSIS — Z1231 Encounter for screening mammogram for malignant neoplasm of breast: Secondary | ICD-10-CM

## 2022-04-30 ENCOUNTER — Ambulatory Visit
Admission: RE | Admit: 2022-04-30 | Discharge: 2022-04-30 | Disposition: A | Discharge status: Home / Self Care | Payer: Medicaid Other | Source: Ambulatory Visit | Attending: Student | Admitting: Student

## 2022-04-30 DIAGNOSIS — Z1231 Encounter for screening mammogram for malignant neoplasm of breast: Secondary | ICD-10-CM | POA: Diagnosis not present

## 2022-05-04 ENCOUNTER — Inpatient Hospital Stay
Admission: RE | Admit: 2022-05-04 | Discharge: 2022-05-04 | Disposition: A | Discharge status: Home / Self Care | Payer: Self-pay | Source: Ambulatory Visit | Attending: *Deleted | Admitting: *Deleted

## 2022-05-04 ENCOUNTER — Other Ambulatory Visit: Payer: Self-pay | Admitting: *Deleted

## 2022-05-04 DIAGNOSIS — Z1231 Encounter for screening mammogram for malignant neoplasm of breast: Secondary | ICD-10-CM

## 2022-06-05 ENCOUNTER — Emergency Department: Payer: Medicaid Other

## 2022-06-05 ENCOUNTER — Other Ambulatory Visit: Payer: Self-pay

## 2022-06-05 ENCOUNTER — Emergency Department
Admission: EM | Admit: 2022-06-05 | Discharge: 2022-06-05 | Disposition: A | Discharge status: Home / Self Care | Payer: Medicaid Other | Attending: Emergency Medicine | Admitting: Emergency Medicine

## 2022-06-05 ENCOUNTER — Encounter: Payer: Self-pay | Admitting: Intensive Care

## 2022-06-05 DIAGNOSIS — R000 Tachycardia, unspecified: Secondary | ICD-10-CM | POA: Diagnosis not present

## 2022-06-05 DIAGNOSIS — R001 Bradycardia, unspecified: Secondary | ICD-10-CM

## 2022-06-05 DIAGNOSIS — D72829 Elevated white blood cell count, unspecified: Secondary | ICD-10-CM | POA: Diagnosis not present

## 2022-06-05 DIAGNOSIS — R220 Localized swelling, mass and lump, head: Secondary | ICD-10-CM | POA: Diagnosis present

## 2022-06-05 DIAGNOSIS — M542 Cervicalgia: Secondary | ICD-10-CM | POA: Insufficient documentation

## 2022-06-05 DIAGNOSIS — K121 Other forms of stomatitis: Secondary | ICD-10-CM | POA: Insufficient documentation

## 2022-06-05 HISTORY — DX: Type 2 diabetes mellitus without complications: E11.9

## 2022-06-05 HISTORY — DX: Rheumatoid arthritis, unspecified: M06.9

## 2022-06-05 LAB — COMPREHENSIVE METABOLIC PANEL
ALT: 19 U/L (ref 0–44)
AST: 19 U/L (ref 15–41)
Albumin: 3.8 g/dL (ref 3.5–5.0)
Alkaline Phosphatase: 78 U/L (ref 38–126)
Anion gap: 8 (ref 5–15)
BUN: 12 mg/dL (ref 6–20)
CO2: 25 mmol/L (ref 22–32)
Calcium: 9.1 mg/dL (ref 8.9–10.3)
Chloride: 107 mmol/L (ref 98–111)
Creatinine, Ser: 0.81 mg/dL (ref 0.44–1.00)
GFR, Estimated: >60 mL/min (ref >60)
Glucose, Bld: 104 mg/dL — ABNORMAL HIGH (ref 70–99)
Potassium: 4.2 mmol/L (ref 3.5–5.1)
Sodium: 140 mmol/L (ref 135–145)
Total Bilirubin: 1.6 mg/dL — ABNORMAL HIGH (ref 0.3–1.2)
Total Protein: 7.6 g/dL (ref 6.5–8.1)

## 2022-06-05 LAB — CBC WITH DIFFERENTIAL/PLATELET
Abs Immature Granulocytes: 0.03 10*3/uL (ref 0.00–0.07)
Basophils Absolute: 0.1 10*3/uL (ref 0.0–0.1)
Basophils Relative: 1 %
Eosinophils Absolute: 0.4 10*3/uL (ref 0.0–0.5)
Eosinophils Relative: 3 %
HCT: 40.5 % (ref 36.0–46.0)
Hemoglobin: 12.2 g/dL (ref 12.0–15.0)
Immature Granulocytes: 0 %
Lymphocytes Relative: 38 %
Lymphs Abs: 4.2 10*3/uL — ABNORMAL HIGH (ref 0.7–4.0)
MCH: 25.5 pg — ABNORMAL LOW (ref 26.0–34.0)
MCHC: 30.1 g/dL (ref 30.0–36.0)
MCV: 84.6 fL (ref 80.0–100.0)
Monocytes Absolute: 0.6 10*3/uL (ref 0.1–1.0)
Monocytes Relative: 5 %
Neutro Abs: 5.9 10*3/uL (ref 1.7–7.7)
Neutrophils Relative %: 53 %
Platelets: 230 10*3/uL (ref 150–400)
RBC: 4.79 MIL/uL (ref 3.87–5.11)
RDW: 12.7 % (ref 11.5–15.5)
WBC: 11.1 10*3/uL — ABNORMAL HIGH (ref 4.0–10.5)
nRBC: 0 % (ref 0.0–0.2)

## 2022-06-05 LAB — MONONUCLEOSIS SCREEN: Mono Screen: NEGATIVE

## 2022-06-05 LAB — GROUP A STREP BY PCR: Group A Strep by PCR: NOT DETECTED

## 2022-06-05 MED ORDER — ACYCLOVIR 400 MG PO TABS: 400.0000 mg | ORAL_TABLET | Freq: Every day | ORAL | 0 refills | Status: AC

## 2022-06-05 MED ORDER — COLCHICINE 0.6 MG PO TABS: 0.6000 mg | ORAL_TABLET | Freq: Two times a day (BID) | ORAL | 0 refills | Status: AC

## 2022-06-05 MED ORDER — LIDOCAINE VISCOUS HCL 2 % MT SOLN: 5.0000 mL | Freq: Three times a day (TID) | OROMUCOSAL | 0 refills | Status: AC | PRN

## 2022-06-05 MED ORDER — IOHEXOL 300 MG/ML  SOLN
75.0000 mL | Freq: Once | INTRAMUSCULAR | Status: AC | PRN
  Administered 2022-06-05: 75 mL via INTRAVENOUS

## 2022-06-05 MED ORDER — PREDNISONE 20 MG PO TABS: 40.0000 mg | ORAL_TABLET | Freq: Every day | ORAL | 0 refills | Status: AC

## 2022-06-05 NOTE — Discharge Instructions (Addendum)
Hold the atorvastatin while taking the medications for 5 days.  Follow-up with a rheumatologist and return to the ER if you develop worsening symptoms.  Try to stay well-hydrated with drinking plenty of fluids including Gatorade without sugar, Pedialyte.  Return to the ER for inability to take drink anything or any other concerns.  You can take Tylenol 1 g every 8 hours and use ibuprofen 600 every 6-8 hours for pain

## 2022-06-05 NOTE — ED Notes (Signed)
Pt presents to ED with c/o of mouth pain. Pt does have multiple sores along her gum line and also on her tongue. Pt states they started this past tues or wed. Pt denies fevers or chills. Pt does state some pain to the L side of her neck but denies issues with swallowing other than pain and pt denies SOB.   Pt does endorse taking immunosuppressive medications due to RA, pt denies any recent med changes.

## 2022-06-05 NOTE — ED Provider Notes (Signed)
[facility] Provider Note    Event Date/Time   First MD Initiated Contact with Patient 06/05/22 1056     (approximate)   History   Allergic Reaction   HPI  Tocarra Gassen is a 56 y.o. female who comes in for tongue and throat swelling for 3 days has been progressively getting worse denies any difficulty breathing.  Patient reports having the pain on her tongue which is what is causing her her symptoms.  She also reports some pain on her inner lip.  She denies any new blood pressure medications or new medications in general.  She does have a rheumatological history that she follows at [facility] for her psoriasis.  She denies any chest pain or shortness of breath but she does report pain on the left side of her neck as well.   I reviewed the records from [place] visit from 02/25/2022 for patient has psoriatic arthritis, anterior nodular scleritis--I do not see any ACE inhibitors or ARB's on medication list  Physical Exam   Triage Vital Signs: ED Triage Vitals  Enc Vitals Group     BP 06/05/22 0952 134/70     Pulse Rate 06/05/22 0952 (!) 51     Resp 06/05/22 0952 18     Temp 06/05/22 0952 98.1 F (36.7 C)     Temp Source 06/05/22 0952 Oral     SpO2 06/05/22 0952 100 %     Weight 06/05/22 0948 227 lb (103 kg)     Height 06/05/22 0948 '5\' 6"'$  (1.676 m)     Head Circumference --      Peak Flow --      Pain Score 06/05/22 0948 10     Pain Loc --      Pain Edu? --      Excl. in [place]? --     Most recent vital signs: Vitals:   06/05/22 0952  BP: 134/70  Pulse: (!) 51  Resp: 18  Temp: 98.1 F (36.7 C)  SpO2: 100%     General: Awake, no distress.  CV:  Good peripheral perfusion.  Resp:  Normal effort.  Abd:  No distention.  Other:  Patient has small ulcers noted 1 on the each side of the tongue some on the right lower gum and 1 on the right inner lip.  No outside lesions are noted.  She reports some pain on the left side of the neck without any mass noted on  examination.   ED Results / Procedures / Treatments   Labs (all labs ordered are listed, but only abnormal results are displayed) Labs Reviewed  CBC WITH DIFFERENTIAL/PLATELET - Abnormal; Notable for the following components:      Result Value   WBC 11.1 (*)    MCH 25.5 (*)    Lymphs Abs 4.2 (*)    All other components within normal limits  COMPREHENSIVE METABOLIC PANEL - Abnormal; Notable for the following components:   Glucose, Bld 104 (*)    Total Bilirubin 1.6 (*)    All other components within normal limits  GROUP A STREP BY PCR  MONONUCLEOSIS SCREEN  RPR    :    RADIOLOGY I have reviewed the CT personally and interpreted no mass noted    IMPRESSION: Unremarkable soft tissues of the neck with no abnormal mass lesion, fluid collection, lymphadenopathy, or mucosal edema/enhancement. Patent aerodigestive tract.    PROCEDURES:  Critical Care performed: No  Procedures   MEDICATIONS ORDERED IN ED: Medications -  No data to display   IMPRESSION / MDM / [place] / ED COURSE  I reviewed the triage vital signs and the nursing notes.   Patient's presentation is most consistent with acute presentation with potential threat to life or bodily function.   Given patient's history of rheumatological disorders it is possible that this could be viral in nature versus herpes, syphilis, mono versus rheumatological process such as Behcet's.  Consider using some lidocaine wash but I do not want her to bite down her tongue and then cause worsening pain.  I do not see any evidence of swelling to suggest this being related to allergic reaction.  Given she does have some pain in her neck it is possible that she could have some lesions in her esophagus but will get labs and CT imaging to rule out any kind of abscess mass or other acute pathology.  Patient is going to reach out to her rheumatologist to see if she can get a follow-up appointment and I think that we could start  patient on some acyclovir given she is otherwise very well-appearing and afebrile.  CBC shows slight elevation of white count.  CMP shows normal creatinine.  Group A strep is negative mono negative syphilis is still pending.  CT imaging was reassuring  Discussed with Dr. Posey Pronto, Mayur Khandu from [facility] rheum. Recommend prednisone '40mg'$  5 days, colchicine 0.6 BID 5 days, and acyclovir.  He is set up a follow-up appointment with patient on Tuesday after the holidays.  Patient heart rate was noted to be low in the 40s so prior to discharge I did get an EKG that showed sinus rate of 49 without any ST elevation or T wave inversions.  When I reviewed her prior notes her heart rates are typically running in the 60s.  She denies any chest pain or shortness of breath or dizziness or lightheadedness.  Suspect this is patient's baseline but we discussed return precautions in regards to this.  I did review her medications and it does not appear that she is on any beta-blockers or calcium channel blockers.  We will give her cardiology referral and she can follow-up outpatient for this.     FINAL CLINICAL IMPRESSION(S) / ED DIAGNOSES   Final diagnoses:  Mouth ulcer  Bradycardia     Rx / DC Orders   ED Discharge Orders          Ordered    magic mouthwash (lidocaine, diphenhydrAMINE, alum & mag hydroxide) suspension  3 times daily PRN        06/05/22 1430    predniSONE (DELTASONE) 20 MG tablet  Daily with breakfast        06/05/22 1430    colchicine 0.6 MG tablet  2 times daily        06/05/22 1430    acyclovir (ZOVIRAX) 400 MG tablet  5 times daily        06/05/22 1430    Ambulatory referral to Cardiology        06/05/22 1444             Note:  This document was prepared using Dragon voice recognition software and may include unintentional dictation errors.   Vanessa Moclips, MD 06/05/22 [phone]

## 2022-06-05 NOTE — ED Notes (Signed)
D/C, new RX, and f/up discussed with pt, pt verbalized understanding. Pt educated to return if s/s worsen. Pt ambulatory with steady gait on D/C.

## 2022-06-05 NOTE — ED Triage Notes (Signed)
Patient has tongue and throat swelling X3 days that she reports has progressively gotten worse. Denies trouble breathing. Reports pain when she smiles

## 2022-06-06 LAB — RPR: RPR Ser Ql: NONREACTIVE

## 2022-06-12 NOTE — Progress Notes (Signed)
 Patient Profile Linda Bennett is a 56 y.o. female who presents to the [facility] for a new patient visit for evaluation and management of the problem(s) listed below:    Requesting Provider: Erminio MARLA Like, PA  PCP: Linda Edsel Caldron, PA  Reason for visit: Bright red blood per rectum  Patient's Chief Complaint:   Chief Complaint  Patient presents with  . Rectal Bleeding    Patient complains of BRBPR one episode back in April. Was treated for hemorrhoids and given suppository and since then no problems.  Last colonoscopy 09/2018.     History of rectal bleeding  (primary encounter diagnosis) History of colonic polyps FH: colon cancer Gastroesophageal reflux disease, unspecified whether esophagitis present Adult celiac disease Hepatic steatosis  Brief GI summary:  -09/27/2018: Colonoscopy [facility] Dr. Myrick Bennett: Indication: PH colon polyps: Impression: - The examined portion of the ileum was normal.The entire examined colon is normal.Internal hemorrhoids.Anal papilla(e) were hypertrophied.No specimens collected.   -12/02/2018: EGD: Heartburn, exclusion of celiac disease: Dr. Lauraine Sor Bennett: Imp:  - Normal esophagus.Z-line regular.Normal stomach. Normal examined duodenum. Biopsied. A: Duodenum, biopsy- No significant pathologic abnormality- No villus abnormalities or inflammatory process identified  Imaging Studies: US  Abdomen 05/02/20 - mildly echogenic liver suggestive of steatosis US  Abdomen 06/02/16 - echogenic liver, likely secondary to hepatic steatosis Health maintenance: HAV/HBV immune.   Fibroscan 06/27/20 - F0-F1, CAP 330   11/1/ 2021: Liver biopsy: Overall the findings are most suggestive of early steatohepatitis, grade 1, stage 0-I.  Dr. Vaughn notes that steatotic liver is more susceptible to drug toxicity which may also play a role in the findings here.  Characteristic features of autoimmune hepatitis are not seen. 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 Percutaneous biopsy track at the level of the anterolateral sixth  interspace. Arterial phase imaging demonstrates a wedge-shaped  region of arterial hyperenhancement in segment 7, likely to reflect  some local hyperemia at the site of biopsy. Several branching  arterially hyperenhancing vessels are seen in this region as well  but without clearly demonstrable pseudoaneurysm or intraparenchymal  hematoma. Suspect a small amount of vascular shunting which is a  normal post biopsy finding.  2. Small amount of intermediate attenuation fluid along the dome of  the right lobe compatible with suspected trace subcapsular hematoma  as seen on comparison ultrasound.  3. Arterial hyperenhancing lesion in the more inferior segment 6,  suspect this is likely benign flash filling hemangioma though if  risk factors are present (in-patient history or based on biopsy  results) further characterization could be made with MRI though this  should be performed on an outpatient basis.  4. Aortic Atherosclerosis (ICD10-I70.0).   -08/12/2020: RUQ US : RUQ pain, recent liver Bx and elevated LFT, prior ABNL CT and cholecystectomy: IMPRESSION:  1. Technically challenging examination due to patient condition as  well as a relatively high positioning of the liver with overlying  rib shadowing.  2. Diffusely heterogenous and increased echogenicity of the liver.  Findings are nonspecific but could be seen in the setting of hepatic  steatosis or intrinsic liver disease. Recommend correlation with  recent liver biopsy results.  3. Small amount  of fluid is seen  extending over the dome of the  liver, which could reflect a small post biopsy hematoma. Difficult  to discern if this is subcapsular. Additionally, there is no clear  sonographic correlate is seen to the branching opacified structures  on the comparison CT angiography of the chest. This could feasibly  reflect a small contained vascular injury which may be a normal  finding post biopsy. Recommend correlation with patient clinical  status, and if tenuous or unstable, further imaging could be  obtained.   - 12/31/2021: CT of the abdomen and pelvis: Chest and left-sided flank pain: No acute abnormality noted.  - 12/31/2021: CT Angio chest:IMPRESSION: CTA of the chest: No evidence of pulmonary emboli. No focal infiltrate is seen.   HISTORY OF PRESENT ILLNESS   Linda Bennett is a 56 year old female with history of T2DM, HLD, obesity, TIA, GERD, celiac disease, depression, fatty liver, hyperlipidemia, psoriatic arthritis, PTSD.  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 She has had extensive liver testing. She is sent for history of rectal bleeding. She would Bennett to establish care with local [facility].  [name] reports seeing small amounts of fresh blood after bowel movements starting in about April.  She thought it was from hemorrhoids.  She saw red blood on the tissue and in the water. She was treated for hemorrhoids and has had no further bleeding.  Bowel movements are normal formed stool without constipation, straining, rectal itching or burning pain. No weight loss, anorexia, abdominal pain, or change in bowel habits.  She does have psoriasis inside her nose and that causes nosebleeds at times. She has a personal history of colon polyps and FH colon cancer in first degree relative. She was  previously followed by [facility].  Her last colonoscopy was performed by Dr. Myrick Bennett in 2019 with  internal hemorrhoid finding.     She was diagnosed with celiac disease in 2014 while living in [place] .  She had biopsies that were positive.  She has been on a gluten-free diet since then.  Patient does get acid reflux and bad  taste up into her throat 5 days out of 7 days.  She is taking pantoprazole  20 mg daily. She has been on a gluten-free diet.  EGD in 2020 showed normal stomach, normal duodenum, biopsies. Negative for  celiac. She has no dysphagia, abdominal pain. She maintains a strict gluten-free diet.  Currently taking 325 mg ASA, and not on Motrin  or Mobic . No  Anti-plt agents or  anticoagulants Family history: Brother with colon cancer age 13  In preparation for the procedure: Positive hx of TIA (12/01/2021), and no history of CAD,  arthroplasty, cardiac surgery, sleep apnea, and problems with sedation.   Problem List: Problem List  Date Reviewed: 06/09/2022          Noted   Plaque psoriasis 06/02/2022   Gastroesophageal reflux disease 04/28/2022   Personal history of colonic polyps 04/28/2022   Psoriatic arthritis (CMS-HCC) 01/26/2022   Hyperlipidemia associated with type 2 diabetes mellitus (CMS-HCC) 01/26/2022   Type 2 diabetes mellitus with hyperglycemia, without long-term current use of insulin  (CMS-HCC) 01/26/2022   TIA (transient ischemic attack) 01/26/2022   NAFLD (nonalcoholic fatty liver disease) 5/75/7976   Depression 01/26/2022   Vitamin D deficiency 01/26/2022   Anxiety 01/26/2022    Medications: Current  Outpatient Medications  Medication Sig Dispense Refill  . aspirin  325 MG tablet Take 325 mg by mouth once daily    . carboxymethylcellulose sodium 0.25 % Administer 1 drop to both eyes Two (2) times a day.    . cholecalciferol (VITAMIN D3) 1000 unit tablet 1,000 Units once daily    . clindamycin (CLEOCIN T) 1 % lotion Apply topically    . CYANOCOBALAMIN ,  VITAMIN B-12, ORAL Take 1,000 mg by mouth once daily    . ergocalciferol, vitamin D2, 1,250 mcg (50,000 unit) capsule Take 1 capsule (50,000 Units total) by mouth once a week 12 capsule 0  . FLUoxetine (PROZAC) 20 MG capsule Take 1 capsule (20 mg total) by mouth once daily 90 capsule 3  . ibuprofen  (MOTRIN ) 200 MG tablet Take 200 mg by mouth every 8 (eight) hours as needed for Pain    . meloxicam  (MOBIC ) 15 MG tablet Take 15 mg by mouth once daily    . pantoprazole  (PROTONIX ) 20 MG DR tablet Take 20 mg by mouth once daily    . rosuvastatin (CRESTOR) 40 MG tablet Take 1 tablet (40 mg total) by mouth once daily 90 tablet 3  . sodium fluoride-pot nitrate 1.1-5 %     . triamcinolone 0.1 % ointment APPLY  OINTMENT TOPICALLY TWICE DAILY 80 g 0   No current facility-administered medications for this visit.    Allergies: Hydrocodone , Gluten protein, Gluten, Oxycodone , Allergenic extract-food-shrimp, Oxycodone -acetaminophen , Shrimp, and Tramadol  Past Medical History: Past Medical History:  Diagnosis Date  . Anxiety   . Arthritis   . Celiac disease   . Depression   . Diabetes mellitus without complication (CMS-HCC)   . GERD (gastroesophageal reflux disease)   . Hyperlipidemia   . Mini stroke 12/01/2021  . Psoriasis   . TIA (transient ischemic attack)   . Vitamin D deficiency     Past Surgical History: Past Surgical History:  Procedure Laterality Date  . CHOLECYSTECTOMY  02/11/2019  . LIVER BIOPSY  2021  . keloid removal      11/2002, 07/2005  . LAPAROSCOPIC TUBAL LIGATION    . MASTECTOMY PARTIAL / LUMPECTOMY Left    lump in left breast  . REPAIR ROTATOR CUFF TEAR ACUTE OPEN Left      Family History: Family History  Problem Relation Age of Onset  . Alcohol abuse Mother   . Sudden cardiac death Father   . Alcohol abuse Father   . Bipolar disorder Daughter   . Schizophrenia Daughter   . Depression Daughter   . Reflux disease Daughter   . Bipolar disorder Daughter   .  Depression Daughter   . Anxiety Daughter   . High blood pressure (Hypertension) Daughter   . Pseudotumor cerebri Daughter   . Depression Daughter   . Neuropathy Daughter   . Fibromyalgia Daughter   . Depression Son   . Bipolar disorder Son      Social History: Social History   Socioeconomic History  . Marital status: Single  Tobacco Use  . Smoking status: Former    Types: Cigarettes    Start date: 2007    Quit date: 2019    Years since quitting: 4.7  . Smokeless tobacco: Never  Vaping Use  . Vaping Use: Never used  Substance and Sexual Activity  . Alcohol use: No  . Drug use: No  . Sexual activity: Not Currently    Partners: Male     GENERAL REVIEW OF SYSTEMS   A ROS was performed  including pertinent positive and negatives as documented.  All other systems are negative.  PHYSICAL EXAMINATION   Vitals:   06/02/22 1429  BP: 123/77  Pulse: 59   Body mass index is 36.11 kg/m. Weight: (!) 103 kg (227 lb 2 oz)    Wt Readings from Last 3 Encounters:  06/09/22 (!) 103.4 kg (228 lb)  06/05/22 (!) 103.4 kg (228 lb)  06/02/22 (!) 103 kg (227 lb 2 oz)    PHYSICAL EXAM: General: NAD, alert and oriented x 4 HEENT:  Anicteric Neck: supple, no JVD or thyromegaly. No lymphadenopathy.  Respiratory: CTA bilaterally, no wheezes, crackles, or other adventitious sounds Cardiac: RRR, no murmur, rub, or gallop  Abd: soft, normal bowel sounds, no TTP, no HSM, no rebound or guarding Ext: no edema Skin: Skin color normal, no rashes  Lymph: no LAD Neuro: Grossly intact   REVIEW OF DATA   I have personally reviewed and interpreted the following data today:  Provider notes: Orman Pepper, Edsel Caldron, PA (PCP)  Labs: Initial consult on 06/02/2022  Component Date Value  . Endomysial Antibody IgA * 06/02/2022 Negative   . t-Transglutaminase (tTG)* 06/02/2022 <2   . Immunoglobulin A, Qn, Se* 06/02/2022 168   Appointment on 04/21/2022  Component Date Value  .  Glucose 04/21/2022 128 (H)   . Sodium 04/21/2022 139   . Potassium 04/21/2022 4.4   . Chloride 04/21/2022 103   . Carbon Dioxide (CO2) 04/21/2022 28.2   . Urea Nitrogen (BUN) 04/21/2022 14   . Creatinine 04/21/2022 0.9   . Glomerular Filtration Ra* 04/21/2022 79   . Calcium  04/21/2022 9.5   . AST  04/21/2022 17   . ALT  04/21/2022 25   . Alk Phos (alkaline Phosp* 04/21/2022 101   . Albumin 04/21/2022 4.1   . Bilirubin, Total 04/21/2022 1.0   . Protein, Total 04/21/2022 7.1   . A/G Ratio 04/21/2022 1.4   . Hemoglobin A1C 04/21/2022 6.6 (H)   . Average Blood Glucose (C* 04/21/2022 143   . Cholesterol, Total 04/21/2022 259 (H)   . Triglyceride 04/21/2022 205 (H)   . HDL (High Density Lipopr* 92/81/7976 43.3   . LDL Calculated 04/21/2022 824 (H)   . VLDL Cholesterol 04/21/2022 41   . Cholesterol/HDL Ratio 04/21/2022 6.0   . Creatinine, Random Urine 04/21/2022 165.6   . Urine Albumin, Random 04/21/2022 <7   . Urine Albumin/Creatinine* 04/21/2022 <4.2   . Vitamin D, 25-Hydroxy - * 04/21/2022 19.8 (L)   . Thyroid Stimulating Horm* 04/21/2022 1.344     No results for input(s): IRON, TIBC, PCTSAT, FERRITIN in the last 73719 hours. Lab Results  Component Value Date   TSH 1.344 04/21/2022    ASSESSMENT AND PLAN  Ms. Dilley is a 56 y.o. female seen today for initial evaluation and management Diagnoses and all orders for this visit:  History of rectal bleeding -     Ambulatory Referral to Colonoscopy  History of colonic polyps -     Ambulatory Referral to Colonoscopy  FH: colon cancer  Gastroesophageal reflux disease, unspecified whether esophagitis present  Adult celiac disease -     Celiac Disease Panel - [facility]  Hepatic steatosis  Other orders -     peg-electrolyte (NULYTELY) solution; Take 4,000 mLs by mouth once for 1 dose   56 year old female with history of rectal bleeding in April, no evidence of anemia, normal bowel habits. No diarrhea or hx of IBD. She was  treated for hemorrhoids with resolution of  bleeding. A recent colonoscopy in 2019 performed at [facility] that showed internal hemorrhoids.  Etiology of rectal bleeding likely hemorrhoids.  She does have a personal history of colon polyps and family history of colon cancer in brother.  Recommend repeat colonoscopy for further evaluation and management.   PLAN: -Schedule colonoscopy [facility] for history of colon polyps and rectal bleeding.  -The patient is given procedure information including indications for procedure, benefits of procedure as well as possible complications, including but not limited to, bleeding, perforation, drug reaction and infection. The patient agrees with this plan & written consent will be obtained.   - F/up pending results   For rectal bleeding history: We discussed: 1.   Ensure stools are soft -  minimize hard stools, constipation and try not to strain on the toilet  2.  Drink as much fluids as possible 3.  Increase fiber - either dietary fiber in foods or OTC fiber supplement such as metamucil, Benefiber or Citrucel  4.  If stools still hard, can try a stool softener Bennett Colace (up to 4 tabs per day) or a mild laxative such as Miralax - 1 capful per day mixed in juice or water  5.  For internal hemorrhoids, you can try an OTC steroid suppository.  E.g., preparation H suppositories (these are hydrocortisone suppositories, you can certainly get generic store brand). You can use these for up to 1-2 weeks at a time maximum.  6.  There are higher dose prescription strength suppositories that I would be happy to call in, but the cost is surprisingly high.     GERD: Breakthrough reflux on once daily PPI. EGD is UTD presents of Barrett's, ulcers, gastritis, EOE, and negative celiac pathology.  PLAN: 1. take the pantoprazole  40 mg daily 30 min before breakfast. This is also called Protonix  and is listed as active in your med list.   2. May use famotidine  20 mg (sold over the  counter as PEPCID  AC) at bedtime or prn for breakthrough symptoms. 3. Eat a healthy diet, elevating HOB with bricks, and avoid eating/taking pills at night. 4. Avoid GERD triggers such as fatty/spicy foods, caffeine, chocolate, carbonated beverages, and peppermint.  Also avoid NSAIDs, alcohol and tobacco products.  Adult Celiac: Recheck celiac serology on gluten-free diet.  Recent EGD 2020 was unremarkable.  Hepatic Steatosis:  Treatment recommendations for patients with nonalcoholic fatty liver disease include slow weight loss of no more than 1-2 pounds per week with an initial goal of losing 10% of current body weight.  Treating abnormal lipid and glucose levels with lifestyle modifications and/or medications is also important.  If sleep apnea is present, it should be treated as well.  Life-style modification to include routine physical activity of at least 20-30 minutes daily as well as dietary modification is necessary. Specifically a Mediterranean style diet which is composed of fish, white meat, fresh fruit, vegetable, whole grains, and legumes was recommended. Foods enriched with excess refined sugars, such as high fructose corn syrup, and hydrogenated fats should be avoided.   Alcohol avoidance is recommended.    We also recommend pneumococcal vaccine and all other age related appropriate vaccinations.  Follow up with annual CBC, CMet and keep blood sugar, lipids, BP in good control.    Orders Placed This Encounter  Procedures  . Celiac Disease Panel - [facility]  . Ambulatory Referral to Colonoscopy   Requested Prescriptions   Signed Prescriptions Disp Refills  . peg-electrolyte (NULYTELY) solution 4000 mL 0  Sig: Take 4,000 mLs by mouth once for 1 dose   Planned follow up interval: Return in about 1 year (around 06/03/2023).  I personally performed the service, non-incident to. [facility])   All questions were adequately answered to patient's satisfaction.  Patient in agreement  with plan of care.    Kimberly A. Moishe, ANP Adult Nurse Practitioner [facility] [address] [place], [place] [address] [phone]

## 2022-06-25 IMAGING — DX DG CHEST 1V PORT
1 series · 1 of 1 positions shown · non-contrast
Comparison: 03/24/2021

CLINICAL DATA: Altered mental status, chest pain

EXAM:
PORTABLE CHEST 1 VIEW

[chest ap]
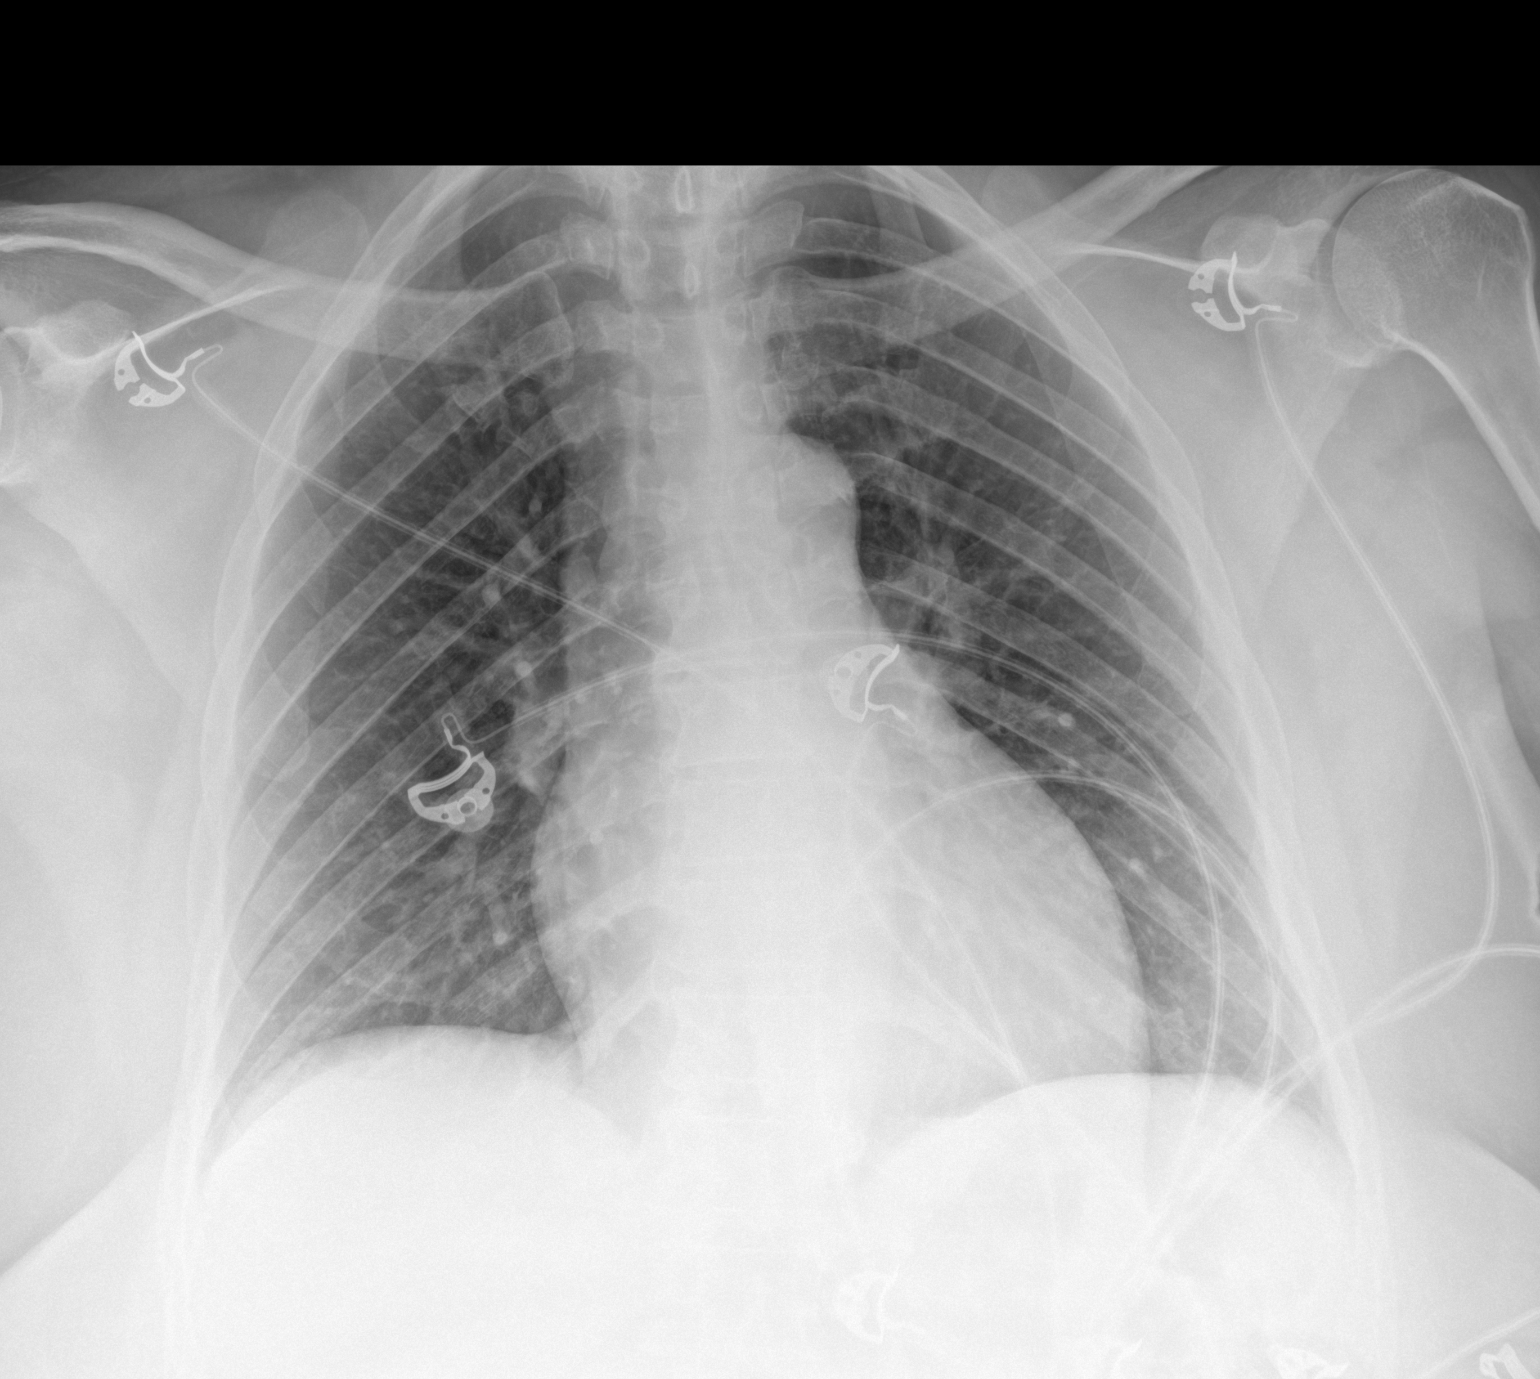

[1 of 1 positions shown; findings below may reference images not displayed]

FINDINGS: The heart size and mediastinal contours are within normal limits.
Both lungs are clear. The visualized skeletal structures are
unremarkable.
IMPRESSION: No active disease.

## 2022-06-25 IMAGING — MR MR HEAD W/O CM
11 series · 47 of 48 positions shown · non-contrast
Comparison: Head CT and CTA 12/01/2021

CLINICAL DATA: Neuro deficit, acute, stroke suspected. Headache and
left neck pain. Right facial sensory deficit. Left lower extremity
drift.

EXAM:
MRI HEAD WITHOUT CONTRAST
TECHNIQUE: Multiplanar, multiecho pulse sequences of the brain and surrounding
structures were obtained without intravenous contrast.

[Series 5: ax dwi_tracew · axial · 3.0mm · 0.65mm/px · z∈[-63,+91]mm · 4 of 48 slices shown]
[im 1/48]
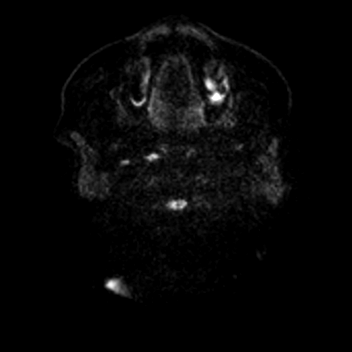
[im 16/48]
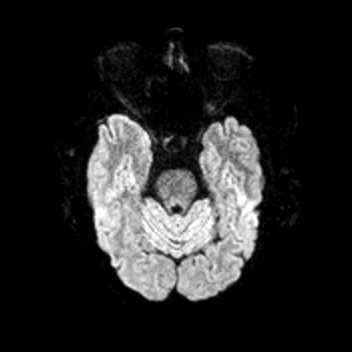
[im 32/48]
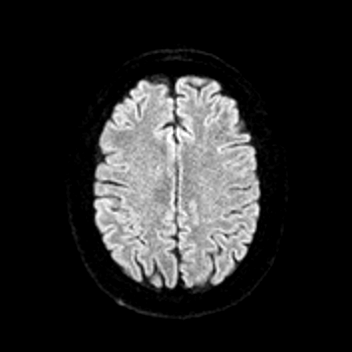
[im 48/48]
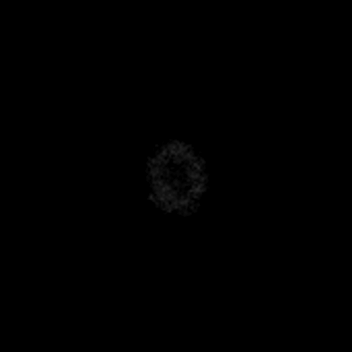

[Series 6: ax dwi_adc · axial · 3.0mm · 0.65mm/px · z∈[-63,+91]mm · 4 of 48 slices shown]
[im 1/48]
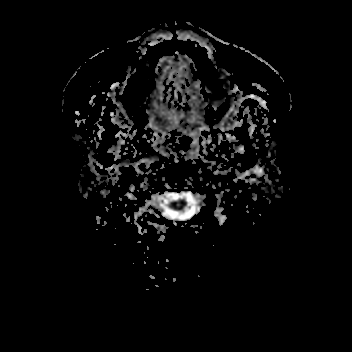
[im 16/48]
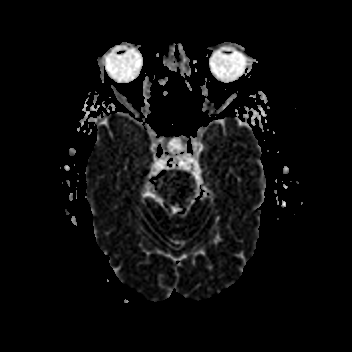
[im 32/48]
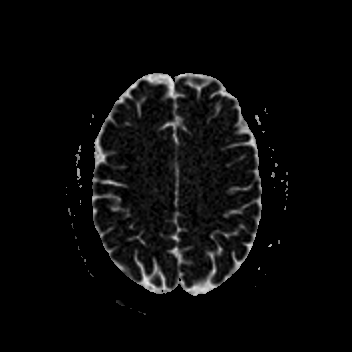
[im 48/48]
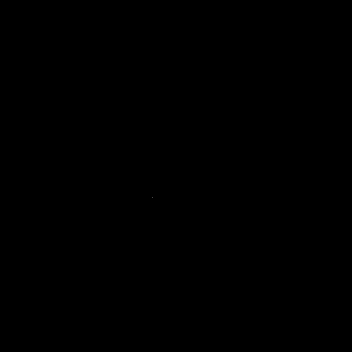

[Series 7: cor dwi_tracew · coronal · 5.0mm · 0.65mm/px · 3 of 38 slices shown]
[im 1/38]
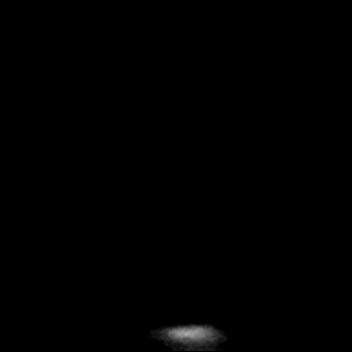
[im 19/38]
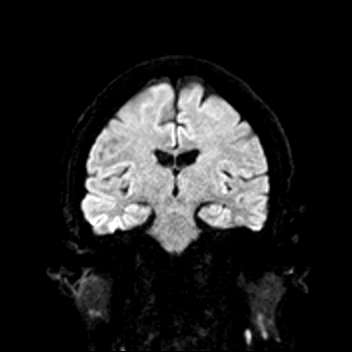
[im 38/38]
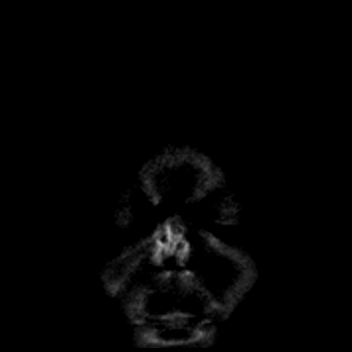

[Series 8: cor dwi_adc · coronal · 5.0mm · 0.65mm/px · 3 of 34 slices shown]
[im 1/34]
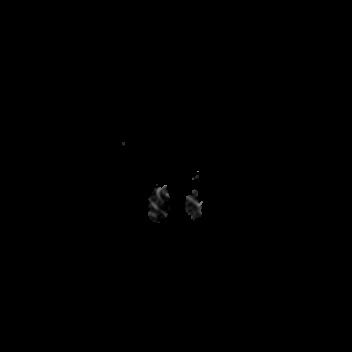
[im 17/34]
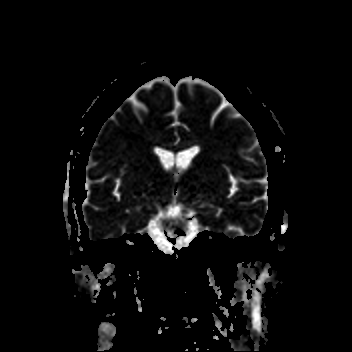
[im 34/34]
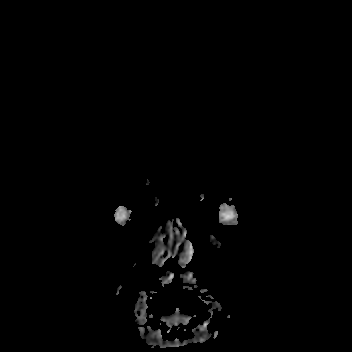

[Series 9: T1 · sagittal · 5.0mm · 0.62mm/px · 2 of 23 slices shown (1 of 2)]
[im 1/23]
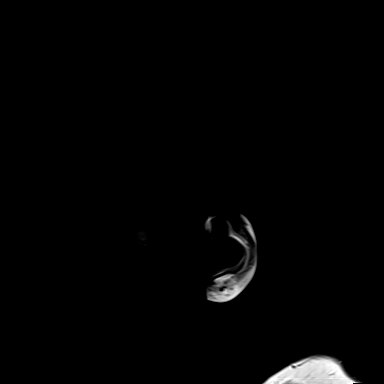
[im 23/23]
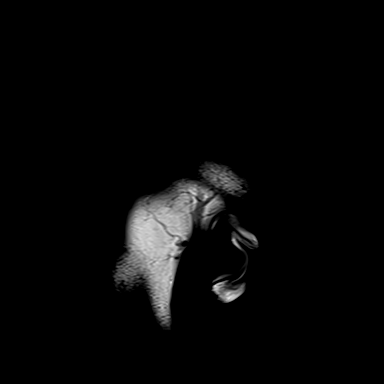

[Series 10: T2 · axial · 5.0mm · 0.53mm/px · z∈[-63,+91]mm · 2 of 27 slices shown (1 of 2)]
[im 1/27]
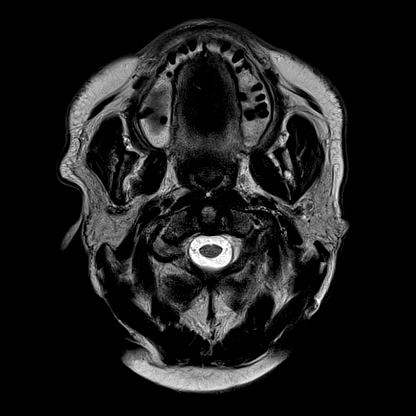
[im 27/27]
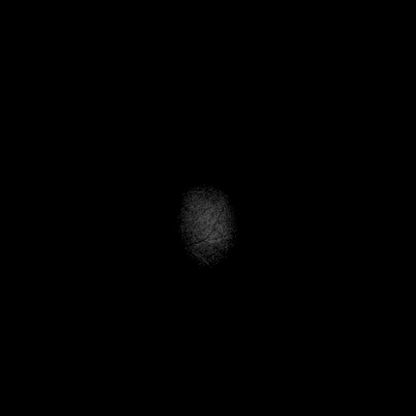

[Series 12: pha_images · axial · 3.0mm · 0.90mm/px · z∈[-74,+102]mm · 5 of 60 slices shown]
[im 1/60]
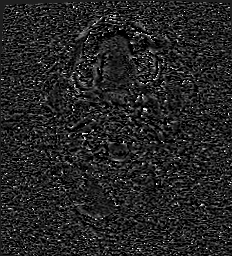
[im 15/60]
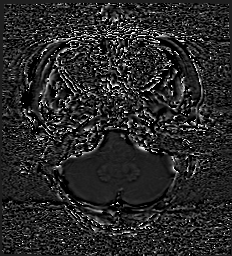
[im 30/60]
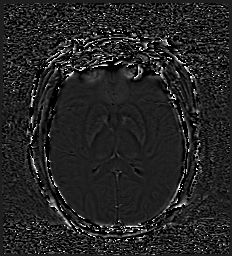
[im 45/60]
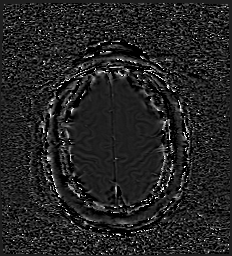
[im 60/60]
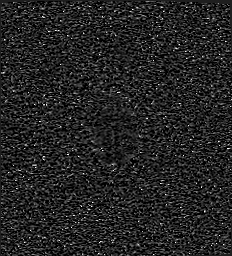

[Series 13: swi_images · axial · 3.0mm · 0.90mm/px · z∈[-74,+102]mm · 5 of 60 slices shown]
[im 1/60]
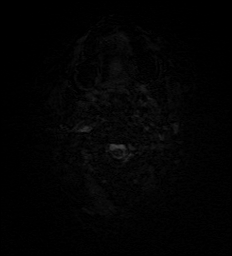
[im 15/60]
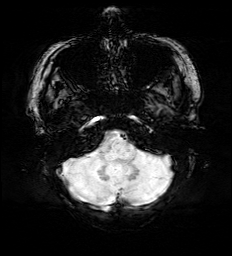
[im 30/60]
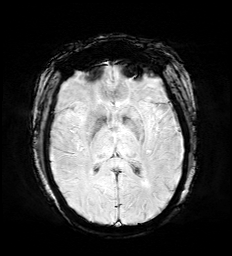
[im 45/60]
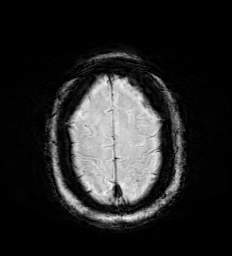
[im 60/60]
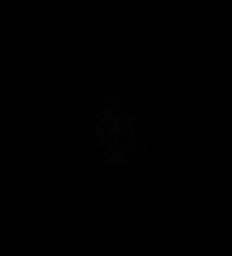

[Series 15: FLAIR · axial · 3.0mm · 0.53mm/px · z∈[-66,+94]mm · 4 of 55 slices shown]
[im 1/55]
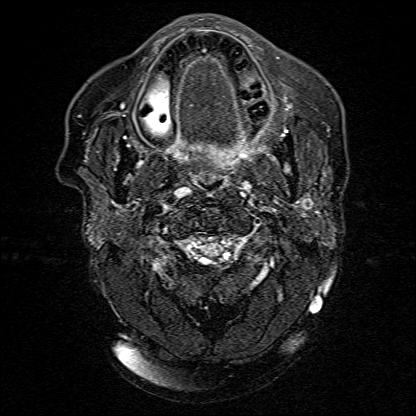
[im 19/55]
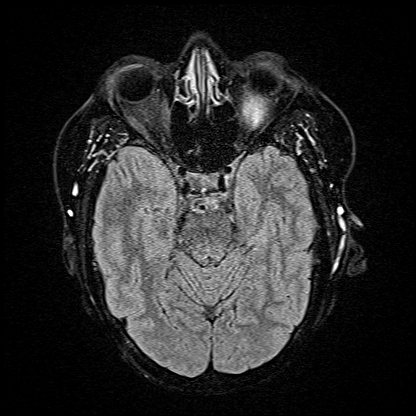
[im 37/55]
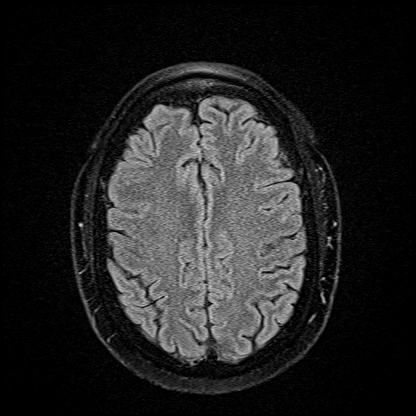
[im 55/55]
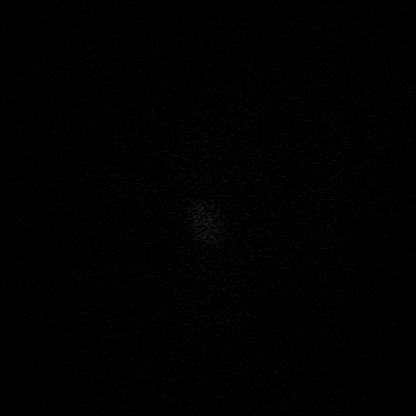

[Series 16: T1 · axial · 1.0mm · 0.98mm/px · z∈[-70,+104]mm · 13 of 176 slices shown (2 of 2)]
[im 1/176]
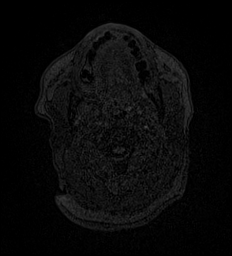
[im 14/176]
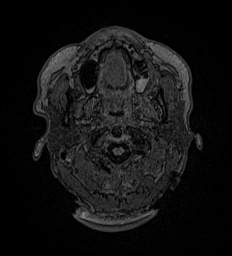
[im 27/176]
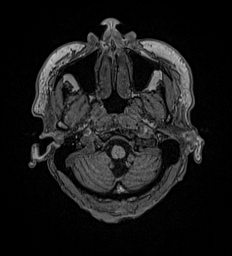
[im 41/176]
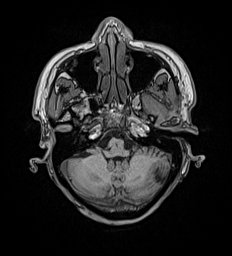
[im 54/176]
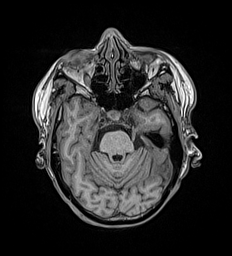
[im 68/176]
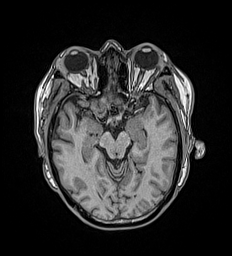
[im 81/176]
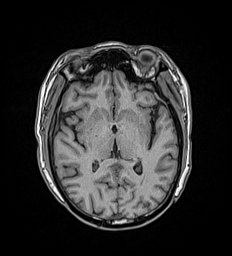
[im 95/176]
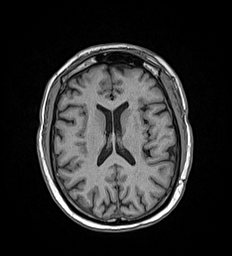
[im 108/176]
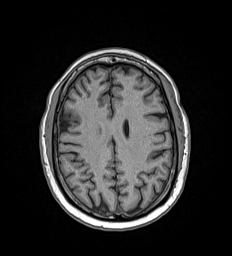
[im 122/176]
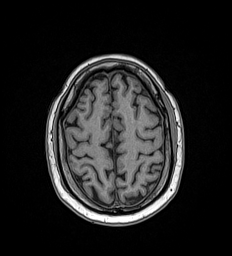
[im 135/176]
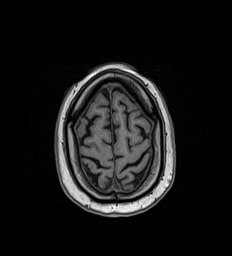
[im 149/176]
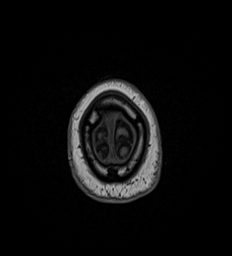
[im 176/176]
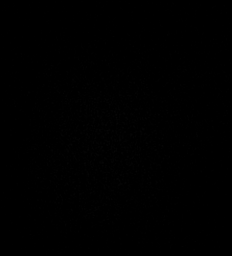

[Series 17: T2 · coronal · 5.0mm · 0.57mm/px · 2 of 29 slices shown (2 of 2)]
[im 1/29]
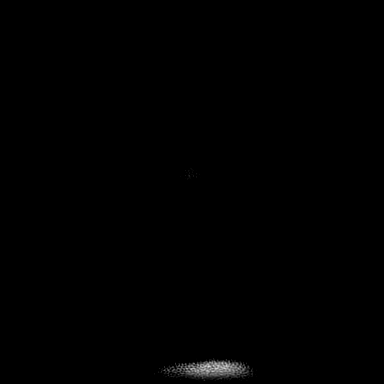
[im 29/29]
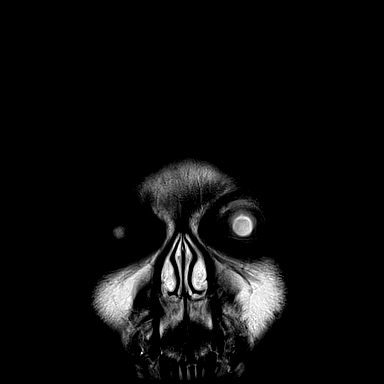

[47 of 48 positions shown; findings below may reference images not displayed]

FINDINGS: Brain: There is no evidence of an acute infarct, intracranial
hemorrhage, mass, midline shift, or extra-axial fluid collection.
The ventricles and sulci are normal. Mild T2 hyperintensities in the
cerebral white matter are most notable in the left periatrial region
and are nonspecific but compatible with chronic small vessel
ischemic disease.

Vascular: Major intracranial vascular flow voids are preserved.

Skull and upper cervical spine: Unremarkable bone marrow signal.

Sinuses/Orbits: Unremarkable orbits. Paranasal sinuses and mastoid
air cells are clear.

Other: Presumed postoperative changes in the posterior scalp.
IMPRESSION: 1. No acute intracranial abnormality.
2. Mild chronic small vessel ischemic disease.

## 2022-06-25 IMAGING — MR MR CERVICAL SPINE WO/W CM
5 of 8 series · 29 of 48 positions shown · IV contrast (10ml Gadavist)
Comparison: None.

CLINICAL DATA: Neck pain, acute, prior cervical surgery.
Myelopathy, acute, cervical spine. Headache and left neck pain.
Right facial sensory deficit. Left lower extremity drift.

EXAM:
MRI CERVICAL SPINE WITHOUT AND WITH CONTRAST
TECHNIQUE: Multiplanar and multiecho pulse sequences of the cervical spine, to
include the craniocervical junction and cervicothoracic junction,
were obtained without and with intravenous contrast.
CONTRAST:  10mL GADAVIST GADOBUTROL 1 MMOL/ML IV SOLN

[Series 5: T2 · sagittal · 3.0mm · 0.62mm/px · 4 of 15 slices shown (1 of 2)]
[im 1/15]
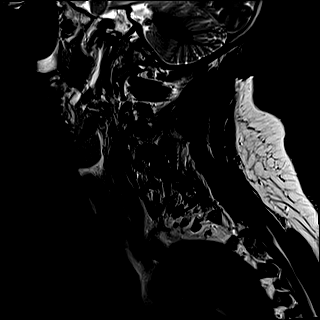
[im 5/15]
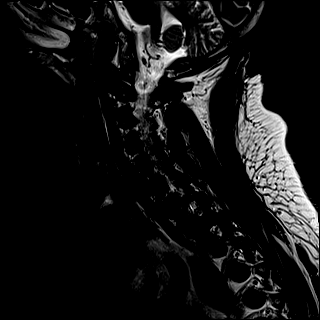
[im 10/15]
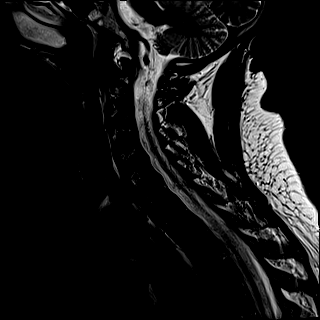
[im 15/15]
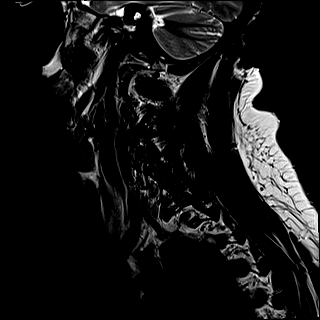

[Series 7: STIR · sagittal · 3.0mm · 0.62mm/px · 4 of 15 slices shown]
[im 1/15]
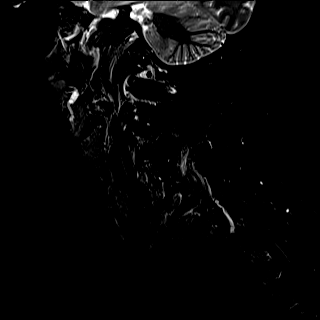
[im 5/15]
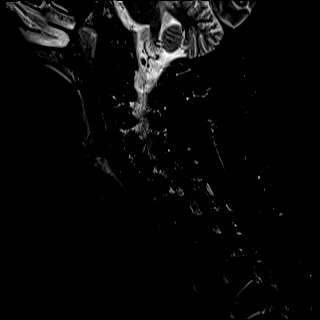
[im 10/15]
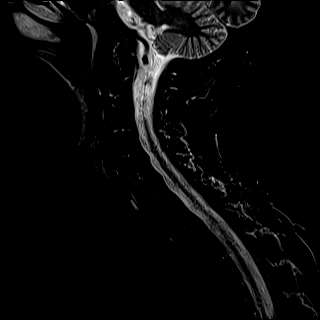
[im 15/15]
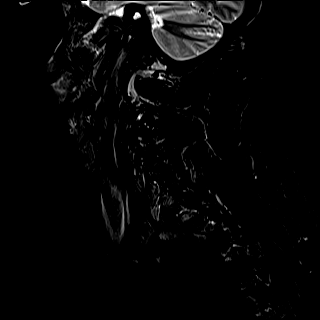

[Series 8: T2 · axial · 3.0mm · 0.70mm/px · z∈[-216,-115]mm · 8 of 31 slices shown (2 of 2)]
[im 1/31]
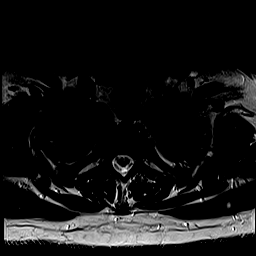
[im 5/31]
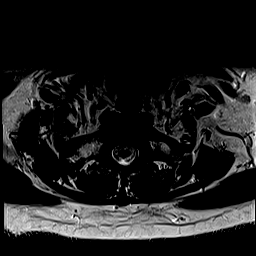
[im 9/31]
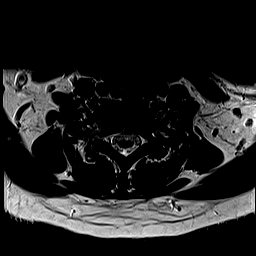
[im 13/31]
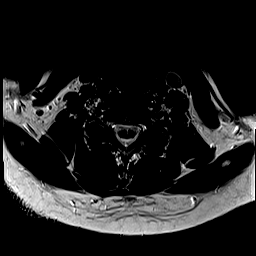
[im 18/31]
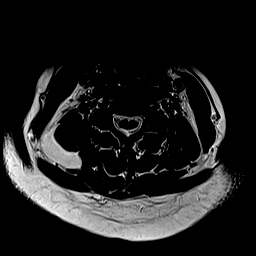
[im 22/31]
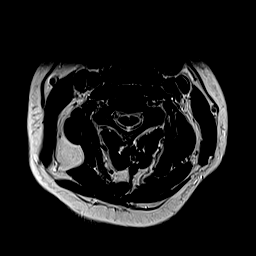
[im 26/31]
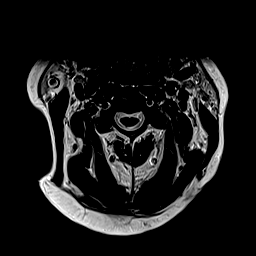
[im 31/31]
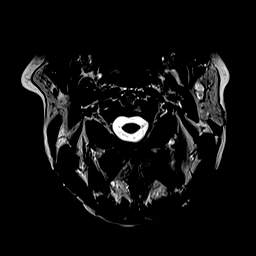

[Series 10: T1 · axial · non-contrast · 3.0mm · 0.35mm/px · z∈[-216,-115]mm · 8 of 31 slices shown]
[im 1/31]
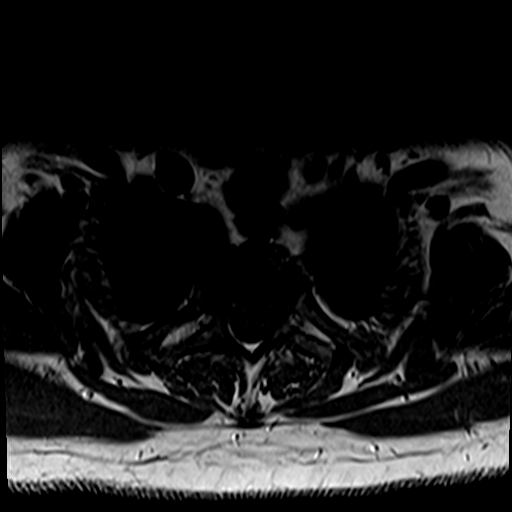
[im 5/31]
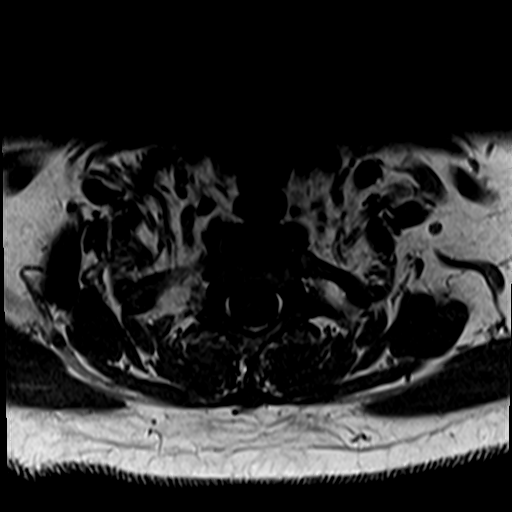
[im 9/31]
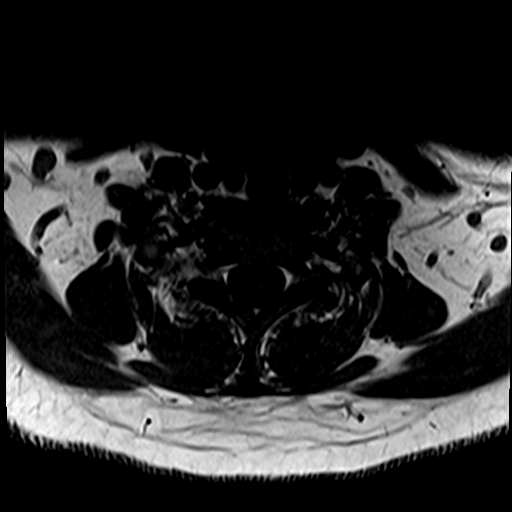
[im 13/31]
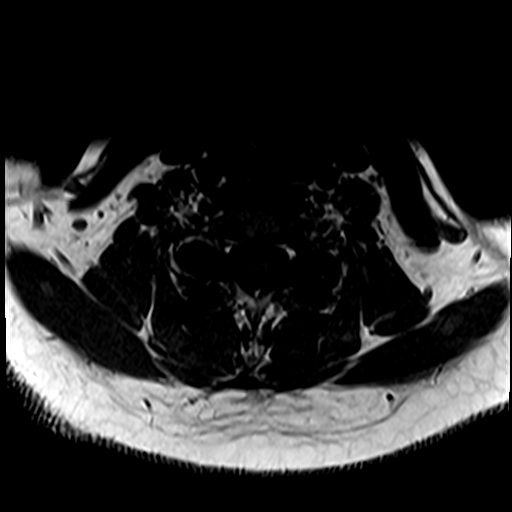
[im 18/31]
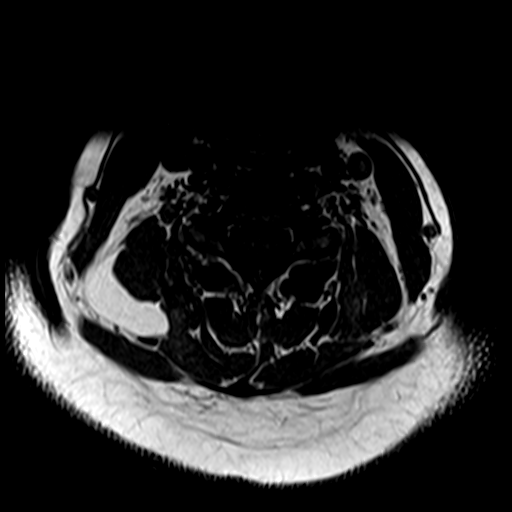
[im 22/31]
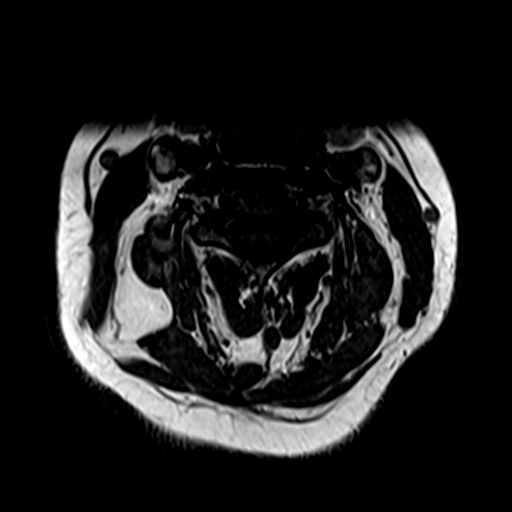
[im 26/31]
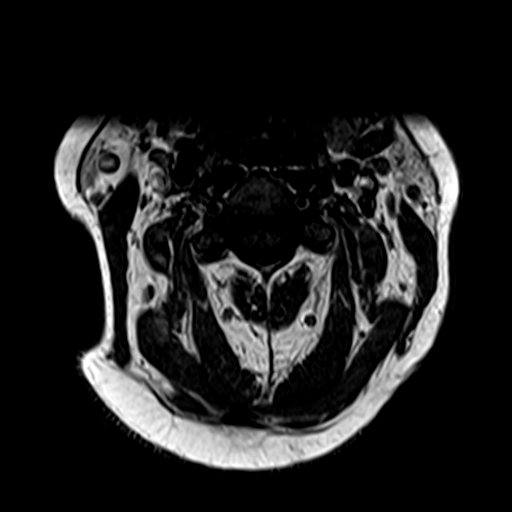
[im 31/31]
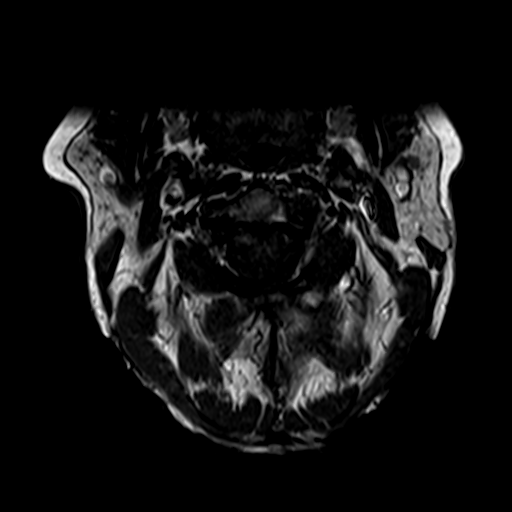

[Series 12: T1 post-contrast · axial · 3.0mm · 0.35mm/px · z∈[-216,-159]mm · 5 of 31 slices shown]
[im 1/31]
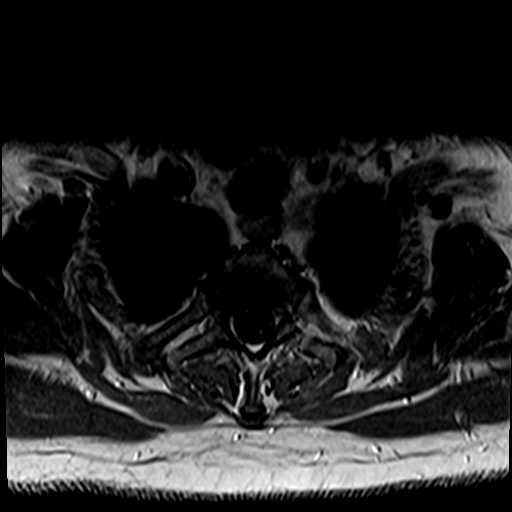
[im 5/31]
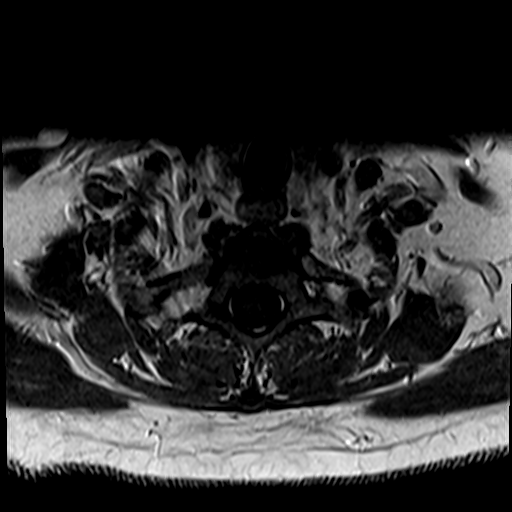
[im 9/31]
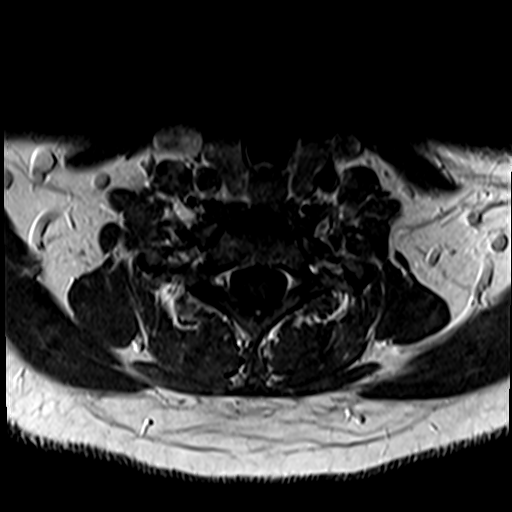
[im 13/31]
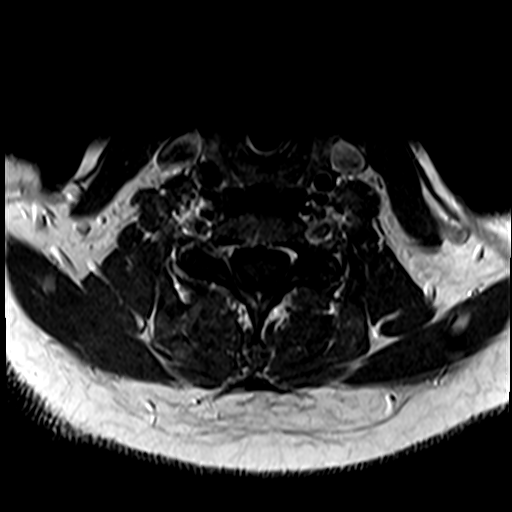
[im 18/31]
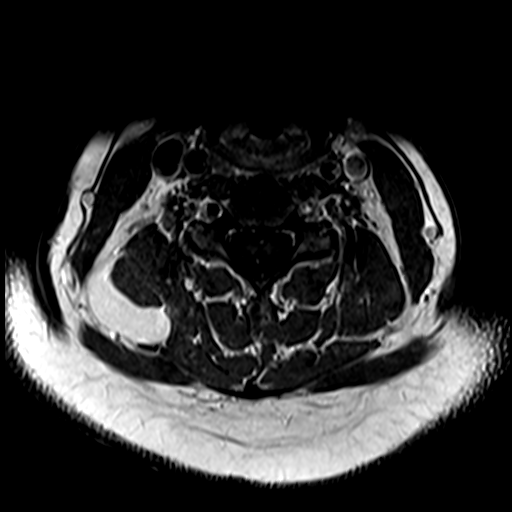

[29 of 48 positions shown; findings below may reference images not displayed]

FINDINGS: Alignment: Normal.

Vertebrae: No fracture, suspicious marrow lesion, or significant
marrow edema.

Cord: Normal cord signal and morphology. No abnormal intradural
enhancement.

Posterior Fossa, vertebral arteries, paraspinal tissues:
Unremarkable.

Disc levels:

Preserved disc space heights.

C2-3: Tiny central disc protrusion without significant stenosis.

C3-4: Minimal facet arthrosis without significant stenosis.

C4-5: Mild disc bulging, mild uncovertebral spurring, and mild facet
arthrosis without significant stenosis.

C5-6: Mild disc bulging and facet arthrosis without significant
stenosis.

C6-7: Negative.

C7-T1: Negative.
IMPRESSION: 1. Mild cervical spondylosis without significant stenosis.
2. Normal appearance of the cervical spinal cord.

## 2022-08-26 ENCOUNTER — Encounter: Payer: Self-pay | Admitting: Gastroenterology

## 2022-08-30 NOTE — H&P (Signed)
Pre-Procedure H&P   Patient ID: Linda Bennett is a 56 y.o. female.  Gastroenterology Provider: Annamaria Helling, DO  Referring Provider: Dawson Bills, NP PCP: Wayland Denis, PA-C  Date: 08/31/2022  HPI [name] is a 56 y.o. female who presents today for Colonoscopy for Bright red blood per rectum .  Patient noted bright red blood per rectum in April of this year.  She is on 325 mg of aspirin daily for history of TIA.  She was treated for hemorrhoids at this.  In time with resolution of issues. Denies any constipation diarrhea or melena.  She last underwent colonoscopy in December 2019 at [facility] with normal TI and internal hemorrhoids as well as hypertrophied anal papillae No weight loss or appetite changes  She does have a personal history of polyps and a family history of colon cancer in her brother at age 35. Patient with a history of celiac disease and status postcholecystectomy   Past Medical History:  Diagnosis Date   Celiac disease    Hidradenitis suppurativa    High cholesterol    Psoriasis    Psoriatic arthritis ([place])    Rheumatoid arthritis ([place])    Stroke ([place])    Type 2 diabetes mellitus ([place])    Vitamin D deficiency     Past Surgical History:  Procedure Laterality Date   BACK SURGERY     BREAST BIOPSY Left 2019   left br bx for papilloma removed   CHOLECYSTECTOMY N/A 02/25/2019   Procedure: LAPAROSCOPIC CHOLECYSTECTOMY;  Surgeon: Jules Husbands, MD;  Location: [facility] ORS;  Service: General;  Laterality: N/A;   LIVER BIOPSY     SHOULDER SURGERY     TONSILLECTOMY     TUBAL LIGATION      Family History Brother-colorectal cancer at age 15 No other h/o GI disease or malignancy  Review of Systems  Constitutional:  Negative for activity change, appetite change, chills, diaphoresis, fatigue, fever and unexpected weight change.  HENT:  Negative for trouble swallowing and voice change.   Respiratory:  Negative for shortness of breath and wheezing.    Cardiovascular:  Negative for chest pain, palpitations and leg swelling.  Gastrointestinal:  Positive for blood in stool. Negative for abdominal distention, abdominal pain, anal bleeding, constipation, diarrhea, nausea, rectal pain and vomiting.  Musculoskeletal:  Negative for arthralgias and myalgias.  Skin:  Negative for color change and pallor.  Neurological:  Negative for dizziness, syncope and weakness.  Psychiatric/Behavioral:  Negative for confusion.   All other systems reviewed and are negative.    Medications No current facility-administered medications on file prior to encounter.   Current Outpatient Medications on File Prior to Encounter  Medication Sig Dispense Refill   aspirin EC 81 MG tablet Take 81 mg by mouth daily. Swallow whole.     atorvastatin (LIPITOR) 40 MG tablet Take 1 tablet (40 mg total) by mouth daily. 30 tablet 0   ibuprofen (ADVIL) 800 MG tablet Take 1 tablet (800 mg total) by mouth every 8 (eight) hours as needed for moderate pain. 15 tablet 0   EPINEPHrine (EPIPEN 2-PAK) 0.3 mg/0.3 mL IJ SOAJ injection Inject 0.3 mLs (0.3 mg total) into the muscle as needed for anaphylaxis. (Patient not taking: Reported on 12/01/2021) 1 each 0   erythromycin ophthalmic ointment Place 1 application into the left eye 4 (four) times daily. (Patient not taking: Reported on 11/04/2021) 3.5 g 0   lactulose (CHRONULAC) 10 GM/15ML solution Take 30 mLs (20 g total) by mouth  daily as needed for mild constipation. 120 mL 0   Multiple Vitamin (MULTIVITAMIN) tablet Take 1 tablet by mouth daily.     oxyCODONE-acetaminophen (PERCOCET/ROXICET) 5-325 MG tablet Take 1 tablet by mouth every 4 (four) hours as needed for severe pain. (Patient not taking: Reported on 08/31/2022) 15 tablet 0   pantoprazole (PROTONIX) 40 MG tablet Take 40 mg by mouth daily. (Patient not taking: Reported on 11/04/2021)     polyethylene glycol (MIRALAX / GLYCOLAX) 17 g packet DISSOLVE 1 PACKET IN LIQUID & DRINK ONCE DAILY  (Patient not taking: Reported on 11/04/2021)     vitamin B-12 (CYANOCOBALAMIN) 1000 MCG tablet Take 1,000 mcg by mouth daily.      Pertinent medications related to GI and procedure were reviewed by me with the patient prior to the procedure   Current Facility-Administered Medications:    0.9 %  sodium chloride infusion, , Intravenous, Continuous, Annamaria Helling, DO      Allergies  Allergen Reactions   Hydrocodone Itching and Hives    Other reaction(s): Hives Other reaction(s): Hives    Gluten Meal Other (See Comments)    Celiac disease Other reaction(s): Other (See Comments) Celiac disease Other reaction(s): has celiac Celiac disease    Oxycodone Itching   Tramadol Itching   Shrimp Extract Allergy Skin Test Itching   Allergies were reviewed by me prior to the procedure  Objective   Body mass index is 37.77 kg/m. Vitals:   08/31/22 0706  BP: 139/82  Pulse: (!) 55  Resp: 16  Temp: (!) 96.5 F (35.8 C)  TempSrc: Temporal  SpO2: 99%  Weight: 106.1 kg  Height: '5\' 6"'$  (1.676 m)      Physical Exam Vitals and nursing note reviewed.  Constitutional:      General: She is not in acute distress.    Appearance: Normal appearance. She is obese. She is not ill-appearing, toxic-appearing or diaphoretic.  HENT:     Head: Normocephalic and atraumatic.     Nose: Nose normal.     Mouth/Throat:     Mouth: Mucous membranes are moist.     Pharynx: Oropharynx is clear.  Eyes:     General: No scleral icterus.    Extraocular Movements: Extraocular movements intact.  Cardiovascular:     Rate and Rhythm: Regular rhythm. Bradycardia present.     Heart sounds: Normal heart sounds. No murmur heard.    No friction rub. No gallop.  Pulmonary:     Effort: Pulmonary effort is normal. No respiratory distress.     Breath sounds: Normal breath sounds. No wheezing, rhonchi or rales.  Abdominal:     General: Bowel sounds are normal. There is no distension.     Palpations: Abdomen  is soft.     Tenderness: There is no abdominal tenderness. There is no guarding or rebound.  Musculoskeletal:     Cervical back: Neck supple.     Right lower leg: No edema.     Left lower leg: No edema.  Skin:    General: Skin is warm and dry.     Coloration: Skin is not jaundiced or pale.  Neurological:     General: No focal deficit present.     Mental Status: She is alert and oriented to person, place, and time. Mental status is at baseline.  Psychiatric:        Mood and Affect: Mood normal.        Behavior: Behavior normal.        Thought  Content: Thought content normal.        Judgment: Judgment normal.      Assessment:  [name] is a 56 y.o. female  who presents today for Colonoscopy for Bright red blood per rectum .  Plan:  Colonoscopy with possible intervention today  Colonoscopy with possible biopsy, control of bleeding, polypectomy, and interventions as necessary has been discussed with the patient/patient representative. Informed consent was obtained from the patient/patient representative after explaining the indication, nature, and risks of the procedure including but not limited to death, bleeding, perforation, missed neoplasm/lesions, cardiorespiratory compromise, and reaction to medications. Opportunity for questions was given and appropriate answers were provided. Patient/patient representative has verbalized understanding is amenable to undergoing the procedure.   Annamaria Helling, DO  [facility] Gastroenterology  Portions of the record may have been created with voice recognition software. Occasional wrong-word or 'sound-a-like' substitutions may have occurred due to the inherent limitations of voice recognition software.  Read the chart carefully and recognize, using context, where substitutions may have occurred.

## 2022-08-31 ENCOUNTER — Encounter
Admission: RE | Disposition: A | Discharge status: Home / Self Care | Payer: Self-pay | Source: Ambulatory Visit | Attending: Gastroenterology

## 2022-08-31 ENCOUNTER — Ambulatory Visit
Admission: RE | Admit: 2022-08-31 | Discharge: 2022-08-31 | Disposition: A | Discharge status: Home / Self Care | Payer: Medicaid Other | Source: Ambulatory Visit | Attending: Gastroenterology | Admitting: Gastroenterology

## 2022-08-31 ENCOUNTER — Ambulatory Visit: Payer: Medicaid Other | Admitting: Anesthesiology

## 2022-08-31 ENCOUNTER — Encounter: Payer: Self-pay | Admitting: Gastroenterology

## 2022-08-31 DIAGNOSIS — E119 Type 2 diabetes mellitus without complications: Secondary | ICD-10-CM | POA: Insufficient documentation

## 2022-08-31 DIAGNOSIS — D120 Benign neoplasm of cecum: Secondary | ICD-10-CM | POA: Insufficient documentation

## 2022-08-31 DIAGNOSIS — Z800 Family history of malignant neoplasm of digestive organs: Secondary | ICD-10-CM | POA: Diagnosis not present

## 2022-08-31 DIAGNOSIS — Z87891 Personal history of nicotine dependence: Secondary | ICD-10-CM | POA: Diagnosis not present

## 2022-08-31 DIAGNOSIS — D122 Benign neoplasm of ascending colon: Secondary | ICD-10-CM | POA: Diagnosis not present

## 2022-08-31 DIAGNOSIS — K6289 Other specified diseases of anus and rectum: Secondary | ICD-10-CM | POA: Diagnosis not present

## 2022-08-31 DIAGNOSIS — Z7982 Long term (current) use of aspirin: Secondary | ICD-10-CM | POA: Insufficient documentation

## 2022-08-31 DIAGNOSIS — K642 Third degree hemorrhoids: Secondary | ICD-10-CM | POA: Insufficient documentation

## 2022-08-31 DIAGNOSIS — Z8719 Personal history of other diseases of the digestive system: Secondary | ICD-10-CM | POA: Insufficient documentation

## 2022-08-31 DIAGNOSIS — K625 Hemorrhage of anus and rectum: Secondary | ICD-10-CM | POA: Diagnosis present

## 2022-08-31 DIAGNOSIS — K219 Gastro-esophageal reflux disease without esophagitis: Secondary | ICD-10-CM | POA: Diagnosis not present

## 2022-08-31 DIAGNOSIS — Z8673 Personal history of transient ischemic attack (TIA), and cerebral infarction without residual deficits: Secondary | ICD-10-CM | POA: Diagnosis not present

## 2022-08-31 DIAGNOSIS — Z9049 Acquired absence of other specified parts of digestive tract: Secondary | ICD-10-CM | POA: Insufficient documentation

## 2022-08-31 HISTORY — PX: COLONOSCOPY WITH PROPOFOL: SHX5780

## 2022-08-31 HISTORY — DX: Cerebral infarction, unspecified: I63.9

## 2022-08-31 LAB — GLUCOSE, CAPILLARY: Glucose-Capillary: 122 mg/dL — ABNORMAL HIGH (ref 70–99)

## 2022-08-31 SURGERY — COLONOSCOPY WITH PROPOFOL
Anesthesia: General

## 2022-08-31 MED ORDER — PROPOFOL 10 MG/ML IV BOLUS
INTRAVENOUS | Status: DC | PRN
  Administered 2022-08-31: 70 mg via INTRAVENOUS

## 2022-08-31 MED ORDER — SODIUM CHLORIDE 0.9 % IV SOLN: INTRAVENOUS | Status: DC

## 2022-08-31 MED ORDER — LIDOCAINE 2% (20 MG/ML) 5 ML SYRINGE
INTRAMUSCULAR | Status: DC | PRN
  Administered 2022-08-31: 20 mg via INTRAVENOUS

## 2022-08-31 MED ORDER — PROPOFOL 1000 MG/100ML IV EMUL
INTRAVENOUS | Status: AC
  Filled 2022-08-31: qty 200

## 2022-08-31 MED ORDER — MIDAZOLAM HCL 2 MG/2ML IJ SOLN
INTRAMUSCULAR | Status: AC
  Filled 2022-08-31: qty 2

## 2022-08-31 MED ORDER — LIDOCAINE HCL (PF) 2 % IJ SOLN
INTRAMUSCULAR | Status: AC
  Filled 2022-08-31: qty 5

## 2022-08-31 MED ORDER — MIDAZOLAM HCL 5 MG/5ML IJ SOLN
INTRAMUSCULAR | Status: DC | PRN
  Administered 2022-08-31: 2 mg via INTRAVENOUS

## 2022-08-31 MED ORDER — PROPOFOL 500 MG/50ML IV EMUL
INTRAVENOUS | Status: DC | PRN
  Administered 2022-08-31: 120 ug/kg/min via INTRAVENOUS

## 2022-08-31 NOTE — Interval H&P Note (Signed)
History and Physical Interval Note: Preprocedure H&P from 08/31/22  was reviewed and there was no interval change after seeing and examining the patient.  Written consent was obtained from the patient after discussion of risks, benefits, and alternatives. Patient has consented to proceed with Colonoscopy with possible intervention   08/31/2022 7:40 AM  Linda Bennett  has presented today for surgery, with the diagnosis of Z86.010  - History of colonic polyps Z87.19  - History of rectal bleeding.  The various methods of treatment have been discussed with the patient and family. After consideration of risks, benefits and other options for treatment, the patient has consented to  Procedure(s): COLONOSCOPY WITH PROPOFOL (N/A) as a surgical intervention.  The patient's history has been reviewed, patient examined, no change in status, stable for surgery.  I have reviewed the patient's chart and labs.  Questions were answered to the patient's satisfaction.     Annamaria Helling

## 2022-08-31 NOTE — Op Note (Signed)
[facility] Gastroenterology Patient Name: Linda Bennett Procedure Date: 08/31/2022 7:34 AM MRN: [id number] Account #: [id number] Date of Birth: Jan 25, 1966 Admit Type: Outpatient Age: 56 Room: [facility] ENDO ROOM 2 Gender: Female Note Status: Supervisor Override Instrument Name: Park Meo [id number] Procedure:             Colonoscopy Indications:           Rectal bleeding, Colon polyps Providers:             Rueben Bash, DO Referring MD:          Wayland Denis (Referring MD) Medicines:             Monitored Anesthesia Care Complications:         No immediate complications. Estimated blood loss:                         Minimal. Procedure:             Pre-Anesthesia Assessment:                        - Prior to the procedure, a History and Physical was                         performed, and patient medications and allergies were                         reviewed. The patient is competent. The risks and                         benefits of the procedure and the sedation options and                         risks were discussed with the patient. All questions                         were answered and informed consent was obtained.                         Patient identification and proposed procedure were                         verified by the physician, the nurse, the anesthetist                         and the technician in the endoscopy suite. Mental                         Status Examination: alert and oriented. Airway                         Examination: normal oropharyngeal airway and neck                         mobility. Respiratory Examination: clear to                         auscultation. CV Examination: RRR, no murmurs, no S3  or S4. Prophylactic Antibiotics: The patient does not                         require prophylactic antibiotics. Prior                         Anticoagulants: The patient has taken no anticoagulant                          or antiplatelet agents. ASA Grade Assessment: III - A                         patient with severe systemic disease. After reviewing                         the risks and benefits, the patient was deemed in                         satisfactory condition to undergo the procedure. The                         anesthesia plan was to use monitored anesthesia care                         (MAC). Immediately prior to administration of                         medications, the patient was re-assessed for adequacy                         to receive sedatives. The heart rate, respiratory                         rate, oxygen saturations, blood pressure, adequacy of                         pulmonary ventilation, and response to care were                         monitored throughout the procedure. The physical                         status of the patient was re-assessed after the                         procedure.                        After obtaining informed consent, the colonoscope was                         passed under direct vision. Throughout the procedure,                         the patient's blood pressure, pulse, and oxygen                         saturations were monitored continuously. The  Colonoscope was introduced through the anus and                         advanced to the the terminal ileum, with                         identification of the appendiceal orifice and IC                         valve. The colonoscopy was performed without                         difficulty. The patient tolerated the procedure well.                         The quality of the bowel preparation was evaluated                         using the BBPS [facility] Bowel Preparation Scale) with                         scores of: Right Colon = 3, Transverse Colon = 3 and                         Left Colon = 3 (entire mucosa seen well with no                         residual  staining, small fragments of stool or opaque                         liquid). The total BBPS score equals 9. The terminal                         ileum, ileocecal valve, appendiceal orifice, and                         rectum were photographed. Findings:      The digital rectal exam was normal. Pertinent negatives include normal       sphincter tone.      Non-bleeding internal hemorrhoids were found during retroflexion and       during perianal exam. The hemorrhoids were Grade III (internal       hemorrhoids that prolapse but require manual reduction). Estimated blood       loss: none.      The terminal ileum appeared normal. Estimated blood loss: none.      Two sessile polyps were found in the ascending colon and cecum. The       polyps were 1 mm in size. These polyps were removed with a jumbo cold       forceps. Resection and retrieval were complete. Estimated blood loss was       minimal.      Anal papilla(e) were hypertrophied. Estimated blood loss: none.      The exam was otherwise without abnormality on direct and retroflexion       views. Impression:            - Non-bleeding internal hemorrhoids.                        -  The examined portion of the ileum was normal.                        - Two 1 mm polyps in the ascending colon and in the                         cecum, removed with a jumbo cold forceps. Resected and                         retrieved.                        - Anal papilla(e) were hypertrophied.                        - The examination was otherwise normal on direct and                         retroflexion views. Recommendation:        - Patient has a contact number available for                         emergencies. The signs and symptoms of potential                         delayed complications were discussed with the patient.                         Return to normal activities tomorrow. Written                         discharge instructions were provided to  the patient.                        - Discharge patient to home.                        - Resume previous diet.                        - Continue present medications.                        - Await pathology results.                        - Repeat colonoscopy for surveillance based on                         pathology results.                        - Return to referring physician as previously                         scheduled.                        - The findings and recommendations were discussed with  the patient. Procedure Code(s):     --- Professional ---                        [phone], Colonoscopy, flexible; with biopsy, single or                         multiple Diagnosis Code(s):     --- Professional ---                        K64.2, Third degree hemorrhoids                        D12.2, Benign neoplasm of ascending colon                        D12.0, Benign neoplasm of cecum                        K62.89, Other specified diseases of anus and rectum                        K62.5, Hemorrhage of anus and rectum CPT copyright 2022 American Medical Association. All rights reserved. The codes documented in this report are preliminary and upon coder review may  be revised to meet current compliance requirements. Attending Participation:      I personally performed the entire procedure. Volney American, DO Annamaria Helling DO, DO 08/31/2022 8:22:42 AM This report has been signed electronically. Number of Addenda: 0 Note Initiated On: 08/31/2022 7:34 AM Scope Withdrawal Time: 0 hours 21 minutes 29 seconds  Total Procedure Duration: 0 hours 28 minutes 15 seconds  Estimated Blood Loss:  Estimated blood loss was minimal.      [facility]

## 2022-08-31 NOTE — Transfer of Care (Signed)
Immediate Anesthesia Transfer of Care Note  Patient: Linda Bennett  Procedure(s) Performed: COLONOSCOPY WITH PROPOFOL  Patient Location: Endoscopy Unit  Anesthesia Type:General  Level of Consciousness: awake  Airway & Oxygen Therapy: Patient Spontanous Breathing  Post-op Assessment: Report given to RN and Post -op Vital signs reviewed and stable  Post vital signs: Reviewed  Last Vitals:  Vitals Value Taken Time  BP 123/79 08/31/22 0821  Temp 35.8 C 08/31/22 0821  Pulse 58 08/31/22 0821  Resp 19 08/31/22 0821  SpO2 100 % 08/31/22 0821  Vitals shown include unvalidated device data.  Last Pain:  Vitals:   08/31/22 0821  TempSrc: Tympanic  PainSc: Asleep         Complications: No notable events documented.

## 2022-08-31 NOTE — Anesthesia Postprocedure Evaluation (Signed)
Anesthesia Post Note  Patient: Linda Bennett  Procedure(s) Performed: COLONOSCOPY WITH PROPOFOL  Patient location during evaluation: Endoscopy Anesthesia Type: General Level of consciousness: awake and alert Pain management: pain level controlled Vital Signs Assessment: post-procedure vital signs reviewed and stable Respiratory status: spontaneous breathing, nonlabored ventilation, respiratory function stable and patient connected to nasal cannula oxygen Cardiovascular status: blood pressure returned to baseline and stable Postop Assessment: no apparent nausea or vomiting Anesthetic complications: no   No notable events documented.   Last Vitals:  Vitals:   08/31/22 0831 08/31/22 0841  BP: 114/77 123/73  Pulse: (!) 57 (!) 56  Resp: 18 18  Temp:    SpO2: 99% 99%    Last Pain:  Vitals:   08/31/22 0841  TempSrc:   PainSc: 0-No pain                 Precious Haws Rinaldo Macqueen

## 2022-08-31 NOTE — Anesthesia Preprocedure Evaluation (Signed)
Anesthesia Evaluation  Patient identified by MRN, date of birth, ID band Patient awake    Reviewed: Allergy & Precautions, NPO status , Patient's Chart, lab work & pertinent test results  History of Anesthesia Complications Negative for: history of anesthetic complications  Airway Mallampati: III  TM Distance: <3 FB Neck ROM: full    Dental  (+) Chipped, Poor Dentition, Missing   Pulmonary neg shortness of breath, former smoker   Pulmonary exam normal        Cardiovascular Exercise Tolerance: Good (-) angina (-) Past MI negative cardio ROS Normal cardiovascular exam     Neuro/Psych CVA  negative psych ROS   GI/Hepatic Neg liver ROS,GERD  Controlled,,  Endo/Other  diabetes, Type 2    Renal/GU negative Renal ROS  negative genitourinary   Musculoskeletal   Abdominal   Peds  Hematology negative hematology ROS (+)   Anesthesia Other Findings Past Medical History: No date: Celiac disease No date: Hidradenitis suppurativa No date: High cholesterol No date: Psoriasis No date: Psoriatic arthritis ([facility]) No date: Rheumatoid arthritis ([facility]) No date: Stroke ([place]) No date: Type 2 diabetes mellitus ([facility]) No date: Vitamin D deficiency  Past Surgical History: No date: BACK SURGERY 2019: BREAST BIOPSY; Left     Comment:  left br bx for papilloma removed 02/25/2019: CHOLECYSTECTOMY; N/A     Comment:  Procedure: LAPAROSCOPIC CHOLECYSTECTOMY;  Surgeon:               Jules Husbands, MD;  Location: [facility] ORS;  Service:               General;  Laterality: N/A; No date: LIVER BIOPSY No date: SHOULDER SURGERY No date: TONSILLECTOMY No date: TUBAL LIGATION  BMI    Body Mass Index: 37.77 kg/m      Reproductive/Obstetrics negative OB ROS                             Anesthesia Physical Anesthesia Plan  ASA: 3  Anesthesia Plan: General   Post-op Pain Management:    Induction:  Intravenous  PONV Risk Score and Plan: Propofol infusion and TIVA  Airway Management Planned: Natural Airway and Nasal Cannula  Additional Equipment:   Intra-op Plan:   Post-operative Plan:   Informed Consent: I have reviewed the patients History and Physical, chart, labs and discussed the procedure including the risks, benefits and alternatives for the proposed anesthesia with the patient or authorized representative who has indicated his/her understanding and acceptance.     Dental Advisory Given  Plan Discussed with: Anesthesiologist, CRNA and Surgeon  Anesthesia Plan Comments: (Patient consented for risks of anesthesia including but not limited to:  - adverse reactions to medications - risk of airway placement if required - damage to eyes, teeth, lips or other oral mucosa - nerve damage due to positioning  - sore throat or hoarseness - Damage to heart, brain, nerves, lungs, other parts of body or loss of life  Patient voiced understanding.)       Anesthesia Quick Evaluation

## 2022-09-01 ENCOUNTER — Encounter: Payer: Self-pay | Admitting: Gastroenterology

## 2022-09-01 LAB — SURGICAL PATHOLOGY

## 2022-10-27 NOTE — Progress Notes (Signed)
 PATIENT PROFILE: Linda Bennett is a 57 y.o. female who presents to the Clinic for consultation at the request of Dr. Isenstein for evaluation of perianal wound.  PCP:  Glover Alm Hail, MD  HISTORY OF PRESENT ILLNESS: Linda Bennett reports she feels an opening in the perianal area for about 10 years.  She endorses that recently has been getting more irritable, painful, drainage.  Drainage is intermittent.  She denies any.  Posterior in that area.  Pain localized to the sacral area.  No pain radiation.  Pain aggravated by applying pressure.  No previous treatments or procedures done in this area.  No previous imaging.   PROBLEM LIST: Problem List  Date Reviewed: 06/09/2022          Noted   Plaque psoriasis 06/02/2022   Gastroesophageal reflux disease 04/28/2022   Personal history of colonic polyps 04/28/2022   Psoriatic arthritis (CMS-HCC) 01/26/2022   Hyperlipidemia associated with type 2 diabetes mellitus  01/26/2022   Type 2 diabetes mellitus with hyperglycemia, without long-term current use of insulin  (CMS-HCC) 01/26/2022   TIA (transient ischemic attack) 01/26/2022   NAFLD (nonalcoholic fatty liver disease) 5/75/7976   Depression 01/26/2022   Vitamin D deficiency 01/26/2022   Anxiety 01/26/2022    GENERAL REVIEW OF SYSTEMS:   General ROS: negative for - chills, fatigue, fever, weight gain or weight loss Allergy and Immunology ROS: negative for - hives  Hematological and Lymphatic ROS: negative for - bleeding problems or bruising, negative for palpable nodes Endocrine ROS: negative for - heat or cold intolerance, hair changes Respiratory ROS: negative for - cough, shortness of breath or wheezing Cardiovascular ROS: no chest pain or palpitations GI ROS: negative for nausea, vomiting, abdominal pain, diarrhea, constipation Musculoskeletal ROS: negative for - joint swelling or muscle pain Neurological ROS: negative for - confusion, syncope Dermatological ROS: negative for pruritus and  rash Psychiatric: negative for anxiety, depression, difficulty sleeping and memory loss  MEDICATIONS: Current Outpatient Medications  Medication Sig Dispense Refill  . aspirin  325 MG tablet Take 325 mg by mouth once daily    . carboxymethylcellulose sodium 0.25 % Administer 1 drop to both eyes Two (2) times a day.    SABRA FLUoxetine (PROZAC) 20 MG capsule Take 1 capsule (20 mg total) by mouth once daily 90 capsule 3  . ibuprofen  (MOTRIN ) 200 MG tablet Take 200 mg by mouth every 8 (eight) hours as needed for Pain    . mometasone (ELOCON) 0.1 % ointment Apply topically once daily    . pantoprazole  (PROTONIX ) 40 MG DR tablet Take 20 mg by mouth once daily    . rosuvastatin (CRESTOR) 40 MG tablet Take 1 tablet (40 mg total) by mouth once daily 90 tablet 3  . TALTZ AUTOINJECTOR 80 mg/mL AtIn monthly    . cholecalciferol (VITAMIN D3) 1000 unit tablet 1,000 Units once daily (Patient not taking: Reported on 10/27/2022)    . clindamycin (CLEOCIN T) 1 % lotion Apply topically (Patient not taking: Reported on 10/27/2022)    . CYANOCOBALAMIN , VITAMIN B-12, ORAL Take 1,000 mg by mouth once daily (Patient not taking: Reported on 10/27/2022)    . ergocalciferol, vitamin D2, 1,250 mcg (50,000 unit) capsule Take 1 capsule (50,000 Units total) by mouth once a week (Patient not taking: Reported on 10/27/2022) 12 capsule 0  . meloxicam  (MOBIC ) 15 MG tablet Take 15 mg by mouth once daily (Patient not taking: Reported on 10/27/2022)    . sodium fluoride-pot nitrate 1.1-5 %  (Patient not taking: Reported  on 10/27/2022)    . triamcinolone 0.1 % ointment APPLY  OINTMENT TOPICALLY TWICE DAILY (Patient not taking: Reported on 10/27/2022) 80 g 0   No current facility-administered medications for this visit.    ALLERGIES: Hydrocodone , Gluten protein, Gluten, Oxycodone , Allergenic extract-food-shrimp, Oxycodone -acetaminophen , Shrimp, and Tramadol  PAST MEDICAL HISTORY: Past Medical History:  Diagnosis Date  . Anxiety   .  Arthritis   . Celiac disease   . Depression   . Diabetes mellitus without complication (CMS-HCC)   . GERD (gastroesophageal reflux disease)   . Hyperlipidemia   . Mini stroke 12/01/2021  . Psoriasis   . TIA (transient ischemic attack)   . Vitamin D deficiency     PAST SURGICAL HISTORY: Past Surgical History:  Procedure Laterality Date  . CHOLECYSTECTOMY  02/11/2019  . LIVER BIOPSY  2021  . Colon @ [facility]  08/31/2022   Tubular adenomas/FHx CC/Repeat 38yrs/SMR  . keloid removal      11/2002, 07/2005  . LAPAROSCOPIC TUBAL LIGATION    . MASTECTOMY PARTIAL / LUMPECTOMY Left    lump in left breast  . REPAIR ROTATOR CUFF TEAR ACUTE OPEN Left      FAMILY HISTORY: Family History  Problem Relation Age of Onset  . Alcohol abuse Mother   . Sudden cardiac death Father   . Alcohol abuse Father   . Bipolar disorder Daughter   . Schizophrenia Daughter   . Depression Daughter   . Reflux disease Daughter   . Bipolar disorder Daughter   . Depression Daughter   . Anxiety Daughter   . High blood pressure (Hypertension) Daughter   . Pseudotumor cerebri Daughter   . Depression Daughter   . Neuropathy Daughter   . Fibromyalgia Daughter   . Depression Son   . Bipolar disorder Son      SOCIAL HISTORY: Social History   Socioeconomic History  . Marital status: Single  Tobacco Use  . Smoking status: Former    Types: Cigarettes    Start date: 2007    Quit date: 2019    Years since quitting: 5.0  . Smokeless tobacco: Never  Vaping Use  . Vaping Use: Never used  Substance and Sexual Activity  . Alcohol use: No  . Drug use: No  . Sexual activity: Not Currently    Partners: Male    PHYSICAL EXAM: Vitals:   10/27/22 1027  BP: 130/74  Pulse: 57   Body mass index is 36.8 kg/m. Weight: (!) 103.4 kg (228 lb)   GENERAL: Alert, active, oriented x3  HEENT: Pupils equal reactive to light. Extraocular movements are intact. Sclera clear. Palpebral conjunctiva normal red color.Pharynx  clear.  NECK: Supple with no palpable mass and no adenopathy.  LUNGS: Sound clear with no rales rhonchi or wheezes.  HEART: Regular rhythm S1 and S2 without murmur.  ABDOMEN: Soft and depressible, nontender with no palpable mass, no hepatomegaly.     RECTAL: There is a long narrow open wound from just above the perineal area all through the gluteal cleft.  No deep fluid collection.  No fistula opening.  Enlarged internal and external hemorrhoids.  EXTREMITIES: Well-developed well-nourished symmetrical with no dependent edema.  NEUROLOGICAL: Awake alert oriented, facial expression symmetrical, moving all extremities.  REVIEW OF DATA: I have reviewed the following data today: Appointment on 10/26/2022  Component Date Value  . Glucose 10/26/2022 107   . Sodium 10/26/2022 141   . Potassium 10/26/2022 4.0   . Chloride 10/26/2022 108   . Carbon Dioxide (CO2)  10/26/2022 26.8   . Urea Nitrogen (BUN) 10/26/2022 11   . Creatinine 10/26/2022 0.9   . Glomerular Filtration Ra* 10/26/2022 75   . Calcium  10/26/2022 9.3   . AST  10/26/2022 32   . ALT  10/26/2022 34   . Alk Phos (alkaline Phosp* 10/26/2022 85   . Albumin 10/26/2022 4.0   . Bilirubin, Total 10/26/2022 0.6   . Protein, Total 10/26/2022 6.6   . A/G Ratio 10/26/2022 1.5   . Hemoglobin A1C 10/26/2022 6.7 (H)   . Average Blood Glucose (C* 10/26/2022 146   . Cholesterol, Total 10/26/2022 227 (H)   . Triglyceride 10/26/2022 97   . HDL (High Density Lipopr* 98/77/7975 46.6   . LDL Calculated 10/26/2022 838 (H)   . VLDL Cholesterol 10/26/2022 19   . Cholesterol/HDL Ratio 10/26/2022 4.9   . Vitamin D, 25-Hydroxy - * 10/26/2022 22.0 (L)      ASSESSMENT: Ms. Mowrer is a 57 y.o. female presenting for consultation for perianal wound.  Patient with long perianal wound without deep collections or sign of infection.  The wound is all the way through the gluteal cleft.  The wound is not deep and narrow.  This seems to be chronic.  Will  start trying with zinc-based ointment with Calmoseptine to see if this help with the healing process and with patient symptoms of irritation.  Wound of sacral region, initial encounter [S31.000A]  PLAN: Apply Calmoseptine ointment on the wound area on the gluteal cleft I will see you in 4 weeks for wound follow up Contact us  if you have any concern.   Patient verbalized understanding, all questions were answered, and were agreeable with the plan outlined above.     Lucas Sjogren, MD  Electronically signed by Lucas Sjogren, MD

## 2022-11-24 NOTE — Progress Notes (Signed)
 HISTORY OF PRESENT ILLNESS:    Linda Bennett is a 57 y.o.female patient who comes for follow up of her sacral wound.  Patient was originally referred by dermatology due to concern of perianal fistula.  Upon evaluation she was found with a large wound that goes through her whole gluteal cleft.  This was a longitudinal open wound.  No purulent opening, no drainage.  The patient main symptom is burning and itching.  At that time I decided to treat with Calmoseptine ointment.  The patient endorses that the was causing a lot of burning and she was not able to use this.  Today she comes with persistent itching, pain on the sacral area.  No pain radiation.  Pain aggravated by pressure.  Patient denies any previous abscess or infections.  Patient denies any previous drainage.  She does have intermittent bleeding of that area.      PAST MEDICAL HISTORY:  Past Medical History:  Diagnosis Date  . Anxiety   . Arthritis   . Celiac disease   . Depression   . Diabetes mellitus without complication (CMS-HCC)   . GERD (gastroesophageal reflux disease)   . Hyperlipidemia   . Mini stroke 12/01/2021  . Psoriasis   . TIA (transient ischemic attack)   . Vitamin D deficiency         PAST SURGICAL HISTORY:   Past Surgical History:  Procedure Laterality Date  . CHOLECYSTECTOMY  02/11/2019  . LIVER BIOPSY  2021  . Colon @ [facility]  08/31/2022   Tubular adenomas/FHx CC/Repeat 70yrs/SMR  . keloid removal      11/2002, 07/2005  . LAPAROSCOPIC TUBAL LIGATION    . MASTECTOMY PARTIAL / LUMPECTOMY Left    lump in left breast  . REPAIR ROTATOR CUFF TEAR ACUTE OPEN Left          MEDICATIONS:  Outpatient Encounter Medications as of 11/24/2022  Medication Sig Dispense Refill  . aspirin  325 MG tablet Take 325 mg by mouth once daily    . blood glucose diagnostic test strip 1 each (1 strip total) 3 (three) times daily 100 each 5  . blood glucose meter kit as directed E11.65 [employer] 1 each  0  . carboxymethylcellulose sodium 0.25 % Administer 1 drop to both eyes Two (2) times a day.    . cholecalciferol (VITAMIN D3) 1000 unit tablet 1,000 Units once daily    . ergocalciferol, vitamin D2, 1,250 mcg (50,000 unit) capsule Take 1 capsule (50,000 Units total) by mouth once a week for 12 doses 12 capsule 0  . FLUoxetine (PROZAC) 20 MG capsule Take 1 capsule (20 mg total) by mouth once daily 90 capsule 1  . ibuprofen  (MOTRIN ) 200 MG tablet Take 200 mg by mouth every 8 (eight) hours as needed for Pain    . lancets Use 1 each 3 (three) times daily 100 each 5  . mometasone (ELOCON) 0.1 % ointment Apply topically once daily    . pantoprazole  (PROTONIX ) 20 MG DR tablet Take 1 tablet (20 mg total) by mouth once daily 90 tablet 1  . rosuvastatin (CRESTOR) 40 MG tablet Take 1 tablet (40 mg total) by mouth once daily 90 tablet 1  . TALTZ AUTOINJECTOR 80 mg/mL AtIn monthly     No facility-administered encounter medications on file as of 11/24/2022.     ALLERGIES:   Hydrocodone , Gluten protein, Gluten, Oxycodone , Allergenic extract-food-shrimp, Oxycodone -acetaminophen , Shrimp, and Tramadol   SOCIAL HISTORY:  Social History   Socioeconomic History  .  Marital status: Single  Tobacco Use  . Smoking status: Former    Types: Cigarettes    Start date: 2007    Quit date: 2019    Years since quitting: 5.1  . Smokeless tobacco: Never  Vaping Use  . Vaping Use: Never used  Substance and Sexual Activity  . Alcohol use: No  . Drug use: No  . Sexual activity: Not Currently    Partners: Male    FAMILY HISTORY:  Family History  Problem Relation Age of Onset  . Alcohol abuse Mother   . Sudden cardiac death Father   . Alcohol abuse Father   . Bipolar disorder Daughter   . Schizophrenia Daughter   . Depression Daughter   . Reflux disease Daughter   . Bipolar disorder Daughter   . Depression Daughter   . Anxiety Daughter   . High blood pressure (Hypertension) Daughter   . Pseudotumor  cerebri Daughter   . Depression Daughter   . Neuropathy Daughter   . Fibromyalgia Daughter   . Depression Son   . Bipolar disorder Son      PHYSICAL EXAM:  Vitals:   11/24/22 0918  BP: 126/78  Pulse: 51  .  Ht:182.2 cm (5' 11.75) Wt:(!) 104.3 kg (230 lb) ADJ:Anib surface area is 2.3 meters squared. Body mass index is 31.41 kg/m.Linda Bennett   GENERAL: Alert, active, oriented x3  BACK: There is a 4 cm x 0.6 cm wound in the upper sacral area.  There is no sign of deep abscess or infection.  There is no surrounding erythema.  No drainage.  No bleeding.  There is a subcentimeter small wound in the lower gluteal cleft area.  Same appearance, smaller in size.  In between these 2 wounds there is a chronic inflammatory skin without open wound.        IMPRESSION:     Wound of sacral region, subsequent encounter [S31.000D]  -Patient with chronic surgical wound.  Unable to identify an etiology.  She was initially referred as a perianal fistula but there is no fistula opening or drainage. -The location of the bigger wound in the upper gluteal cleft area is the usual area of pilonidal disease but patient has not had any previous history of infection.  No history of abscess.  No classical physical appearance of pilonidal disease.  No gluteal cleft pits.  No upper lateral gluteal abscess. -She did not tolerated treatment with zinc oxide local care. -I discussed with patient that I would recommend to be evaluated by a wound specialist.  She may need to have culture of the wound, may be biopsied and better local care.  If she does not respond to local care we discussed about offering excision of the chronic wound with rearrangement of local tissue.  The downside of these more aggressive procedure is that if the it does not heal adequately then she will ended up with a bigger wound more difficult to take care of later. -The patient reports she understood and agreed to get evaluation by wound specialist.    PLAN:  1.  Outpatient referral to wound care surgery for evaluation of chronic sacral wound. 2.  Will see you in 3 months for evaluation of wound after evaluation and management by the wound care center 3.  We discussed about possible excision of wound with skin flaps if no local care tentative or no improvement with local care.  Patient verbalized understanding, all questions were answered, and were agreeable with the plan outlined above.  Lucas Sjogren, MD  Electronically signed by Lucas Sjogren, MD

## 2022-12-14 ENCOUNTER — Encounter: Payer: Medicaid Other | Attending: Internal Medicine | Admitting: Internal Medicine

## 2022-12-14 ENCOUNTER — Ambulatory Visit: Payer: Medicaid Other | Admitting: Internal Medicine

## 2022-12-14 DIAGNOSIS — L732 Hidradenitis suppurativa: Secondary | ICD-10-CM | POA: Insufficient documentation

## 2022-12-14 DIAGNOSIS — Z87891 Personal history of nicotine dependence: Secondary | ICD-10-CM | POA: Diagnosis not present

## 2022-12-14 DIAGNOSIS — M069 Rheumatoid arthritis, unspecified: Secondary | ICD-10-CM | POA: Insufficient documentation

## 2022-12-14 DIAGNOSIS — X58XXXA Exposure to other specified factors, initial encounter: Secondary | ICD-10-CM | POA: Diagnosis not present

## 2022-12-14 DIAGNOSIS — E11622 Type 2 diabetes mellitus with other skin ulcer: Secondary | ICD-10-CM | POA: Insufficient documentation

## 2022-12-14 DIAGNOSIS — S31809A Unspecified open wound of unspecified buttock, initial encounter: Secondary | ICD-10-CM | POA: Diagnosis not present

## 2022-12-14 NOTE — Progress Notes (Signed)
HEMA, MCALEESE ([id number]) [id number] Page 1 of 7 Visit Report for 12/14/2022 Chief Complaint Document Details Patient Name: Date of Service: Linda Bennett, Linda Bennett 12/14/2022 10:15 A M Medical Record Number: [id number] Patient Account Number: [id number] Date of Birth/Sex: Treating RN: 29-Dec-1965 (57 y.o. [place] Primary Care Provider: Juluis Pitch Other Clinician: Referring Provider: Treating Provider/Extender: Abel Presto Weeks in Treatment: 0 Information Obtained from: Patient Chief Complaint 12/14/2022; Intergluteal cleft wound Electronic Signature(s) Signed: 12/14/2022 1:05:58 PM By: Kalman Shan DO Entered By: Kalman Shan on 12/14/2022 11:01:35 -------------------------------------------------------------------------------- HPI Details Patient Name: Date of Service: [address] 12/14/2022 10:15 A M Medical Record Number: [id number] Patient Account Number: [id number] Date of Birth/Sex: Treating RN: [birth year] (57 y.o. [place] Primary Care Provider: Juluis Pitch Other Clinician: Referring Provider: Treating Provider/Extender: Abel Presto Weeks in Treatment: 0 History of Present Illness HPI Description: 12/14/2022 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 She states she has followed with dermatology for this issue but is unable to convey the treatments she has had. Dermatology was concerned that this was a perianal fistula. She also saw general surgery on 2/20 for this issue. Per their evaluation there was no concern for a fistula. Now she is being referred to our clinic. She has tried Calmoseptine ointment with little benefit. It is unclear what other treatments she has tried. Electronic  Signature(s) Signed: 12/14/2022 1:05:58 PM By: Kalman Shan DO Entered By: Kalman Shan on 12/14/2022 11:06:28 Linda Bennett ([id number]) [id number] Page 2 of 7 -------------------------------------------------------------------------------- Physical Exam Details Patient Name: Date of Service: RASHIKA, FAUCI 12/14/2022 10:15 A M Medical Record Number: [id number] Patient Account Number: [id number] Date of Birth/Sex: Treating RN: 1966-06-15 (57 y.o. [place] Primary Care Provider: Juluis Pitch Other Clinician: Referring Provider: Treating Provider/Extender: Abel Presto Weeks in Treatment: 0 Constitutional . Cardiovascular . Psychiatric . Notes T the intergluteal cleft there is skin breakdown. No tunneling noted. No purulent drainage. Small wound on the right buttocks. Similar characteristics to o hidradenitis. Electronic Signature(s) Signed: 12/14/2022 1:05:58 PM By: Kalman Shan DO Entered By: Kalman Shan on 12/14/2022 11:08:26 -------------------------------------------------------------------------------- Physician Orders Details Patient Name: Date of Service: [address] 12/14/2022 10:15 A M Medical Record Number: [id number] Patient Account Number: [id number] Date of Birth/Sex: Treating RN: 03/17/1966 (57 y.o. [place] Primary Care Provider: Juluis Pitch Other Clinician: Referring Provider: Treating Provider/Extender: Abel Presto Weeks in Treatment: 0 Verbal / Phone Orders: No Diagnosis Coding ICD-10 Coding Code Description [phone] Unspecified open wound of unspecified buttock, initial encounter E11.622 Type 2 diabetes mellitus with other skin ulcer Follow-up Appointments Return Appointment in 1 week. Bathing/ Shower/ Hygiene May shower; gently cleanse wound with antibacterial soap, rinse and pat dry prior to dressing wounds KONETA, KAPPUS  ([id number]) [id number] Page 3 of 7 Additional Orders / Instructions Other: - apply antibiotic cream and cover with gauze. Wound Treatment Patient Medications llergies: shrimp, sesame seed, tramadol, hydrocodone, gluten A Notifications Medication Indication Start End 12/14/2022 clindamycin phosphate DOSE 1 - topical 1 % gel - Apply topically twice daily to the affected area Electronic Signature(s) Signed: 12/14/2022 1:05:58 PM By: Kalman Shan DO Previous Signature: 12/14/2022 11:18:55 AM Version By: Kalman Shan DO Entered By: Kalman Shan on 12/14/2022 11:19:07 -------------------------------------------------------------------------------- Problem List Details Patient Name: Date of Service: [place],  [facility] 12/14/2022 10:15 A M Medical Record Number: [id number] Patient Account Number: [id number] Date of Birth/Sex: Treating RN: Jul 17, 1966 (57 y.o. [place] Primary Care Provider: Juluis Pitch Other Clinician: Referring Provider: Treating Provider/Extender: Abel Presto Weeks in Treatment: 0 Active Problems ICD-10 Encounter Code Description Active Date MDM Diagnosis S31.809A Unspecified open wound of unspecified buttock, initial encounter 12/14/2022 No Yes E11.622 Type 2 diabetes mellitus with other skin ulcer 12/14/2022 No Yes L73.2 Hidradenitis suppurativa 12/14/2022 No Yes Inactive Problems Resolved Problems Electronic Signature(s) Signed: 12/14/2022 1:05:58 PM By: Kalman Shan DO Entered By: Kalman Shan on 12/14/2022 11:00:45 Linda Bennett ([id number]) [id number] Page 4 of 7 -------------------------------------------------------------------------------- Progress Note Details Patient Name: Date of Service: Linda, Bennett 12/14/2022 10:15 A M Medical Record Number: [id number] Patient Account Number: [id number] Date of Birth/Sex: Treating RN: 03/11/1966 (57 y.o. [place] Primary Care Provider: Juluis Pitch Other Clinician: Referring Provider: Treating Provider/Extender: Abel Presto Weeks in Treatment: 0 Subjective Chief Complaint Information obtained from Patient 12/14/2022; Intergluteal cleft wound History of Present Illness (HPI) 12/14/2022 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 She states she has followed with dermatology for this issue but is unable to convey the treatments she has had. Dermatology was concerned that this was a perianal fistula. She also saw general surgery on 2/20 for this issue. Per their evaluation there was no concern for a fistula. Now she is being referred to our clinic. She has tried Calmoseptine ointment with little benefit. It is unclear what other treatments she has tried. Patient History Information obtained from Patient. Allergies shrimp, sesame seed, tramadol, hydrocodone, gluten Social History Former smoker - quit 5 year ago, Marital Status - Single, Alcohol Use - Never, Drug Use - No History, Caffeine Use - Never. Medical History Endocrine Patient has history of Type II Diabetes Musculoskeletal Patient has history of Rheumatoid Arthritis Patient is treated with Controlled Diet. Blood sugar is tested. Blood sugar results noted at the following times: Breakfast - 115. Review of Systems (ROS) Eyes Complains or has symptoms of Glasses / Contacts. Ear/Nose/Mouth/Throat Denies complaints or symptoms of Difficult clearing ears, Sinusitis. Hematologic/Lymphatic Denies complaints or symptoms of Bleeding / Clotting Disorders, Human Immunodeficiency Virus. Respiratory Denies complaints or symptoms of Chronic or frequent coughs, Shortness of Breath. Cardiovascular Denies complaints or symptoms of Chest pain, LE  edema. Gastrointestinal Denies complaints or symptoms of Frequent diarrhea, Nausea, Vomiting. Genitourinary Denies complaints or symptoms of Kidney failure/ Dialysis, Incontinence/dribbling. Immunological Denies complaints or symptoms of Hives, Itching. Integumentary (Skin) Complains or has symptoms of Wounds, Bleeding or bruising tendency. Neurologic Denies complaints or symptoms of Numbness/parasthesias, Focal/Weakness. Psychiatric Complains or has symptoms of Anxiety. Denies complaints or symptoms of Claustrophobia. DREANA, ORTLIP ([id number]) [id number] Page 5 of 7 Objective Constitutional Vitals Time Taken: 10:15 AM, Height: 68 in, Weight: 230 lbs, BMI: 35, Temperature: 97.6 F, Pulse: 52 bpm, Respiratory Rate: 18 breaths/min, Blood Pressure: 159/84 mmHg. General Notes: T the intergluteal cleft there is skin breakdown. No tunneling noted. No purulent drainage. Small wound on the right buttocks. Similar o characteristics to hidradenitis. Integumentary (Hair, Skin) Wound #1 status is Open. Original cause of wound was Gradually Appeared. The date acquired was: 12/13/2021. The wound is located on the Midline Sacrum. The wound measures 8cm length x 0.9cm width x 0.2cm depth; 5.655cm^2 area and 1.131cm^3 volume. There  is Fat Layer (Subcutaneous Tissue) exposed. There is no tunneling noted, however, there is undermining starting at 11:00 and ending at 2:00 with a maximum distance of 0.3cm. There is a medium amount of serous drainage noted. There is small (1-33%) pink granulation within the wound bed. 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 She has been followed with dermatology and general surgery for this issue. It is unclear besides  Calmoseptine what else has been used. On physical exam she had some characteristics similar to hidradenitis to the wound bed. She does have a history of this and has required surgery under her breasts to help with this issue. At this time I recommended starting clindamycin ointment. If conservative treatment with wound care does not show any improvements over the next few weeks she will need to follow back up with dermatology and general surgery for this issue. Plan Follow-up Appointments: Return Appointment in 1 week. Bathing/ Shower/ Hygiene: May shower; gently cleanse wound with antibacterial soap, rinse and pat dry prior to dressing wounds Additional Orders / Instructions: Other: - apply antibiotic cream and cover with gauze. 1. Clindamycin ointment 2. Follow-up in 1 week Electronic Signature(s) Signed: 12/14/2022 1:05:58 PM By: Kalman Shan DO Entered By: Kalman Shan on 12/14/2022 11:13:24 -------------------------------------------------------------------------------- ROS/PFSH Details Patient Name: Date of Service: [address] 12/14/2022 10:15 A M Medical Record Number: [id number] Patient Account Number: [id number] Date of Birth/Sex: Treating RN: Dec 28, 1965 (57 y.o. [place] Primary Care Provider: Juluis Pitch Other Clinician: Referring Provider: Treating Provider/Extender: Abel Presto [place], [place] ([id number]) [id number].pdf Page 6 of 7 Weeks in Treatment: 0 Information Obtained From Patient Eyes Complaints and Symptoms: Positive for: Glasses / Contacts Ear/Nose/Mouth/Throat Complaints and Symptoms: Negative for: Difficult clearing ears; Sinusitis Hematologic/Lymphatic Complaints and Symptoms: Negative for: Bleeding / Clotting Disorders; Human Immunodeficiency Virus Respiratory Complaints and Symptoms: Negative for: Chronic or frequent coughs; Shortness of Breath Cardiovascular Complaints and  Symptoms: Negative for: Chest pain; LE edema Gastrointestinal Complaints and Symptoms: Negative for: Frequent diarrhea; Nausea; Vomiting Genitourinary Complaints and Symptoms: Negative for: Kidney failure/ Dialysis; Incontinence/dribbling Immunological Complaints and Symptoms: Negative for: Hives; Itching Integumentary (Skin) Complaints and Symptoms: Positive for: Wounds; Bleeding or bruising tendency Neurologic Complaints and Symptoms: Negative for: Numbness/parasthesias; Focal/Weakness Psychiatric Complaints and Symptoms: Positive for: Anxiety Negative for: Claustrophobia Endocrine Medical History: Positive for: Type II Diabetes Time with diabetes: 1year Treated with: Diet Blood sugar tested every day: Yes Tested : 3 Blood sugar testing results: Breakfast: 115 Musculoskeletal Medical History: Positive for: Rheumatoid Arthritis Oncologic LAKRISHA, BATDORF ([id number]) [id number] Page 7 of 7 Immunizations Pneumococcal Vaccine: Received Pneumococcal Vaccination: No Implantable Devices None Family and Social History Former smoker - quit 5 year ago; Marital Status - Single; Alcohol Use: Never; Drug Use: No History; Caffeine Use: Never Electronic Signature(s) Signed: 12/14/2022 10:41:35 AM By: Gretta Cool, BSN, RN, CWS, Kim RN, BSN Signed: 12/14/2022 1:05:58 PM By: Kalman Shan DO Entered By: Gretta Cool BSN, RN, CWS, Kim on 12/14/2022 10:21:35 -------------------------------------------------------------------------------- SuperBill Details Patient Name: Date of Service: DIANNE, GRENIER 12/14/2022 Medical Record Number: [id number] Patient Account Number: [id number] Date of Birth/Sex: Treating RN: 05-13-1966 (58 y.o. [place] Primary Care Provider: Juluis Pitch Other Clinician: Referring Provider: Treating Provider/Extender: Abel Presto Weeks in Treatment: 0 Diagnosis Coding ICD-10 Codes Code  Description [phone] Unspecified open wound of unspecified buttock,  initial encounter E11.622 Type 2 diabetes mellitus with other skin ulcer L73.2 Hidradenitis suppurativa Facility Procedures : CPT4 Code: [id number] Description: [id number] - WOUND CARE VISIT-LEV 3 EST PT Modifier: Quantity: 1 Physician Procedures : CPT4 Code Description Modifier [id number] - WC PHYS LEVEL 4 - NEW PT ICD-10 Diagnosis Description [id number] Unspecified open wound of unspecified buttock, initial encounter E11.622 Type 2 diabetes mellitus with other skin ulcer L73.2 Hidradenitis  suppurativa Quantity: 1 Electronic Signature(s) Signed: 12/14/2022 1:05:58 PM By: Kalman Shan DO Entered By: Kalman Shan on 12/14/2022 11:13:41

## 2022-12-14 NOTE — Progress Notes (Addendum)
Linda Bennett ([id number]) [id number] Page 1 of 7 Visit Report for 12/14/2022 Allergy List Details Patient Name: Date of Service: Linda Bennett, Linda Bennett 12/14/2022 10:15 A M Medical Record Number: [id number] Patient Account Number: [id number] Date of Birth/Sex: Treating Bennett: 28-Feb-1966 (57 y.o. Female) Linda Bennett Primary Care Linda Bennett: Linda Bennett Other Clinician: Referring Linda Bennett: Treating Linda Bennett: Linda Bennett in Treatment: 0 Allergies Active Allergies shrimp sesame seed tramadol hydrocodone gluten Allergy Notes Electronic Signature(s) Signed: 12/14/2022 10:41:35 AM By: Linda Bennett, BSN, Bennett, CWS, Linda Bennett, BSN Entered By: Linda Bennett, BSN, Bennett, CWS, Linda on 12/14/2022 10:18:17 -------------------------------------------------------------------------------- Arrival Information Details Patient Name: Date of Service: [address] 12/14/2022 10:15 A M Medical Record Number: [id number] Patient Account Number: [id number] Date of Birth/Sex: Treating Bennett: 31-Mar-1966 (57 y.o. Female) Linda Bennett Primary Care Linda Bennett: Linda Bennett Other Clinician: Referring Linda Bennett: Treating Linda Bennett/Extender: Linda Bennett in Treatment: 0 Visit Information Patient Arrived: Ambulatory Arrival Time: 10:07 Accompanied By: self Transfer Assistance: None Patient Identification Verified: Yes Secondary Verification Process Completed: Yes Patient Requires Transmission-Based Precautions: No Patient Has Alerts: Yes Patient Alerts: Patient on Blood Thinner TYPE II Diabetic 325 aspirin Electronic Signature(s) Signed: 12/14/2022 10:41:35 AM By: Linda Bennett, BSN, Bennett, CWS, Linda Bennett, BSN Entered By: Linda Bennett, BSN, Bennett, CWS, Linda on 12/14/2022 10:14:45 -------------------------------------------------------------------------------- Clinic Level of Care Assessment Details Patient Name: Date of Service: [place] 12/14/2022 10:15  A M Medical Record Number: [id number] Patient Account Number: [id number] Date of Birth/Sex: Treating Bennett: Sep 20, 1966 (57 y.o. Female) Linda Bennett Primary Care Linda Bennett: Linda Bennett Other Clinician: Referring Linda Bennett: Treating Linda Bennett: Linda Bennett in Treatment: 0 [place], [place] ([id number]) [id number] Page 2 of 7 Clinic Level of Care Assessment Items TOOL 2 Quantity Score []  - 0 Use when only an EandM is performed on the INITIAL visit ASSESSMENTS - Nursing Assessment / Reassessment X- 1 20 General Physical Exam (combine w/ comprehensive assessment (listed just below) when performed on new pt. evals) X- 1 25 Comprehensive Assessment (HX, ROS, Risk Assessments, Wounds Hx, etc.) ASSESSMENTS - Wound and Skin A ssessment / Reassessment X - Simple Wound Assessment / Reassessment - one wound 1 5 []  - 0 Complex Wound Assessment / Reassessment - multiple wounds []  - 0 Dermatologic / Skin Assessment (not related to wound area) ASSESSMENTS - Ostomy and/or Continence Assessment and Care []  - 0 Incontinence Assessment and Management []  - 0 Ostomy Care Assessment and Management (repouching, etc.) PROCESS - Coordination of Care X - Simple Patient / Family Education for ongoing care 1 15 []  - 0 Complex (extensive) Patient / Family Education for ongoing care []  - 0 Staff obtains Chiropractor, Records, T Results / Process Orders est []  - 0 Staff telephones HHA, Nursing Homes / Clarify orders / etc []  - 0 Routine Transfer to another Facility (non-emergent condition) []  - 0 Routine Hospital Admission (non-emergent condition) []  - 0 New Admissions / Manufacturing engineer / Ordering NPWT Apligraf, etc. , []  - 0 Emergency Hospital Admission (emergent condition) X- 1 10 Simple Discharge Coordination []  - 0 Complex (extensive) Discharge Coordination PROCESS - Special Needs []  - 0 Pediatric / Minor Patient Management []   - 0 Isolation Patient Management []  - 0 Hearing / Language / Visual special needs []  - 0 Assessment of Community assistance (transportation, D/C planning, etc.) []  - 0 Additional assistance / Altered mentation []  - 0 Support Surface(s) Assessment (bed, cushion, seat, etc.) INTERVENTIONS - Wound Cleansing / Measurement X- 1 5 Wound Imaging (photographs - any  number of wounds) []  - 0 Wound Tracing (instead of photographs) X- 1 5 Simple Wound Measurement - one wound []  - 0 Complex Wound Measurement - multiple wounds X- 1 5 Simple Wound Cleansing - one wound []  - 0 Complex Wound Cleansing - multiple wounds INTERVENTIONS - Wound Dressings []  - 0 Small Wound Dressing one or multiple wounds []  - 0 Medium Wound Dressing one or multiple wounds []  - 0 Large Wound Dressing one or multiple wounds []  - 0 Application of Medications - injection INTERVENTIONS - Miscellaneous []  - 0 External ear exam Linda Bennett ([id number]) [id number] Page 3 of 7 []  - 0 Specimen Collection (cultures, biopsies, blood, body fluids, etc.) []  - 0 Specimen(s) / Culture(s) sent or taken to Lab for analysis []  - 0 Patient Transfer (multiple staff / Michiel Sites Lift / Similar devices) []  - 0 Simple Staple / Suture removal (25 or less) []  - 0 Complex Staple / Suture removal (26 or more) []  - 0 Hypo / Hyperglycemic Management (close monitor of Blood Glucose) []  - 0 Ankle / Brachial Index (ABI) - do not check if billed separately Has the patient been seen at the hospital within the last three years: Yes Total Score: 90 Level Of Care: New/Established - Level 3 Electronic Signature(s) Signed: 12/14/2022 4:44:23 PM By: Linda Bennett, BSN, Bennett, CWS, Linda Bennett, BSN Entered By: Linda Bennett, BSN, Bennett, CWS, Linda on 12/14/2022 11:02:07 -------------------------------------------------------------------------------- Encounter Discharge Information Details Patient Name: Date of Service: [place]  12/14/2022 10:15 A M Medical Record Number: [id number] Patient Account Number: [id number] Date of Birth/Sex: Treating Bennett: 12-25-1965 (57 y.o. Female) Linda Bennett Primary Care Larken Urias: Linda Bennett Other Clinician: Referring Winston Misner: Treating Gavrielle Streck/Extender: Linda Bennett in Treatment: 0 Encounter Discharge Information Items Discharge Condition: Stable Ambulatory Status: Ambulatory Discharge Destination: Home Transportation: Private Auto Accompanied By: self Schedule Follow-up Appointment: Yes Clinical Summary of Care: Electronic Signature(s) Signed: 12/14/2022 4:44:23 PM By: Linda Bennett, BSN, Bennett, CWS, Linda Bennett, BSN Entered By: Linda Bennett, BSN, Bennett, CWS, Linda on 12/14/2022 11:05:44 -------------------------------------------------------------------------------- Lower Extremity Assessment Details Patient Name: Date of Service: [place] 12/14/2022 10:15 A M Medical Record Number: [id number] Patient Account Number: [id number] Date of Birth/Sex: Treating Bennett: 1965-12-18 (57 y.o. Female) Linda Bennett Primary Care Carmesha Morocco: Linda Bennett Other Clinician: Referring Reilynn Lauro: Treating Hollis Tuller/Extender: Linda Bennett in Treatment: 0 Electronic Signature(s) Signed: 12/14/2022 10:41:35 AM By: Linda Bennett, BSN, Bennett, CWS, Linda Bennett, BSN Entered By: Linda Bennett, BSN, Bennett, CWS, Linda on 12/14/2022 10:16:00 -------------------------------------------------------------------------------- Multi Wound Chart Details Patient Name: Date of Service: [place] 12/14/2022 10:15 A M Medical Record Number: [id number] Patient Account Number: [id number] Date of Birth/Sex: Treating Bennett: 19-Sep-1966 (57 y.o. Female) Linda Bennett Primary Care Albina Gosney: Linda Bennett Other Clinician: Referring Paulyne Mooty: Treating Alfard Cochrane/Extender: Linda Bennett in Treatment: 0 [place], [place] ([id number]) [id number] Page 4 of  7 Vital Signs Height(in): 68 Pulse(bpm): 52 Weight(lbs): 230 Blood Pressure(mmHg): 159/84 Body Mass Index(BMI): 35 Temperature(F): 97.6 Respiratory Rate(breaths/min): 18 [1:Photos: No Photos Midline Sacrum Wound Location: Gradually Appeared Wounding Event: Hidradenitis Primary Etiology: Type II Diabetes, Rheumatoid Arthritis N/A Comorbid History: 12/13/2021 Date Acquired: 0 Bennett of Treatment: Open Wound Status: No Wound Recurrence:  Yes Clustered Wound: 3 Clustered Quantity: 8x0.9x0.2 Measurements L x W x D (cm) 5.655 A (cm) : rea 1.131 Volume (cm) : 11 Starting Position 1 (o'clock): 2 Ending Position 1 (o'clock): 0.3 Maximum Distance 1 (cm): Yes Undermining: Full Thickness  Without Exposed Classification: Support Structures Medium Exudate Amount:  Serous Exudate Type: amber Exudate Color: Small (1-33%) Granulation Amount: Pink Granulation Quality: Fat Layer (Subcutaneous Tissue): Yes N/A Exposed Structures: Fascia: No  Tendon: No Muscle: No Joint: No Bone: No Medium (34-66%) Epithelialization:] [N/A:N/A N/A N/A N/A N/A N/A N/A N/A N/A N/A N/A N/A N/A N/A N/A N/A N/A N/A N/A N/A N/A] Treatment Notes Electronic Signature(s) Signed: 12/14/2022 1:05:58 PM By: Geralyn Corwin DO Entered By: Geralyn Corwin on 12/14/2022 11:00:55 -------------------------------------------------------------------------------- Multi-Disciplinary Care Plan Details Patient Name: Date of Service: [address] 12/14/2022 10:15 A M Medical Record Number: [id number] Patient Account Number: [id number] Date of Birth/Sex: Treating Bennett: 04-22-1966 (57 y.o. Female) Linda Bennett Primary Care Kalei Mckillop: Linda Bennett Other Clinician: Referring Jaiyana Canale: Treating Arlis Everly/Extender: Linda Bennett in Treatment: 0 Active Inactive Electronic Signature(s) Signed: 01/20/2023 3:58:09 PM By: Linda Bennett, BSN, Bennett, CWS, Linda Bennett, BSN Previous Signature: 12/14/2022 4:44:23 PM Version By: Linda Bennett, BSN, Bennett,  CWS, Linda Bennett, BSN Entered By: Linda Bennett, BSN, Bennett, CWS, Linda on 01/20/2023 15:58:09 -------------------------------------------------------------------------------- Pain Assessment Details Patient Name: Date of Service: [address] 12/14/2022 10:15 A Jamal Collin, Wilnette Kales ([id number]) [id number] Page 5 of 7 Medical Record Number: [id number] Patient Account Number: [id number] Date of Birth/Sex: Treating Bennett: 05/19/1966 (57 y.o. Female) Linda Bennett Primary Care Lakely Elmendorf: Linda Bennett Other Clinician: Referring Madelyn Tlatelpa: Treating Oma Alpert/Extender: Linda Bennett in Treatment: 0 Active Problems Location of Pain Severity and Description of Pain Patient Has Paino No Site Locations Pain Management and Medication Current Pain Management: Notes patient denies pain at this time. Electronic Signature(s) Signed: 12/14/2022 10:41:35 AM By: Linda Bennett, BSN, Bennett, CWS, Linda Bennett, BSN Entered By: Linda Bennett, BSN, Bennett, CWS, Linda on 12/14/2022 10:15:05 -------------------------------------------------------------------------------- Patient/Caregiver Education Details Patient Name: Date of Service: [place], [place] 3/11/2024andnbsp10:15 A M Medical Record Number: [id number] Patient Account Number: [id number] Date of Birth/Gender: Treating Bennett: 13-Nov-1965 (57 y.o. Female) Linda Bennett Primary Care Physician: Linda Bennett Other Clinician: Referring Physician: Treating Physician/Extender: Linda Bennett in Treatment: 0 Education Assessment Education Provided To: Patient Education Topics Provided Elevated Blood Sugar/ Impact on Healing: Handouts: Elevated Blood Sugars: How Do They Affect Wound Healing Methods: Demonstration, Explain/Verbal Responses: State content correctly Infection: Handouts: Hygiene and Infection Prevention Methods: Demonstration, Explain/Verbal Responses: State content correctly Wound/Skin  Impairment: Handouts: Caring for Your Ulcer, Other: wound care as prescribed Methods: Explain/Verbal Responses: State content correctly [place], Wilnette Kales ([id number]) [id number] Page 6 of 7 Electronic Signature(s) Signed: 12/14/2022 4:44:23 PM By: Linda Bennett, BSN, Bennett, CWS, Linda Bennett, BSN Entered By: Linda Bennett, BSN, Bennett, CWS, Linda on 12/14/2022 11:04:56 -------------------------------------------------------------------------------- Wound Assessment Details Patient Name: Date of Service: [place], [place] 12/14/2022 10:15 A M Medical Record Number: [id number] Patient Account Number: [id number] Date of Birth/Sex: Treating Bennett: February 02, 1966 (58 y.o. Female) Linda Bennett Primary Care Dekendrick Uzelac: Linda Bennett Other Clinician: Referring Jacilyn Sanpedro: Treating Jibril Mcminn/Extender: Linda Bennett in Treatment: 0 Wound Status Wound Number: 1 Primary Etiology: Hidradenitis Wound Location: Midline Sacrum Wound Status: Open Wounding Event: Gradually Appeared Comorbid History: Type II Diabetes, Rheumatoid Arthritis Date Acquired: 12/13/2021 Bennett Of Treatment: 0 Clustered Wound: Yes Wound Measurements Length: (cm) Width: (cm) Depth: (cm) Clustered Quantity: Area: (cm) Volume: (cm) 8 % Reduction in Area: 0.9 % Reduction in Volume: 0.2 Epithelialization: Medium (34-66%) 3 Tunneling: No 5.655 Undermining: Yes 1.131 Starting Position (o'clock): 11 Ending Position (o'clock): 2 Maximum Distance: (cm) 0.3 Wound Description Classification: Full Thickness Without Exposed Supp Exudate Amount: Medium Exudate Type: Serous Exudate Color: amber ort Structures Foul Odor After Cleansing:  No Slough/Fibrino No Wound Bed Granulation Amount: Small (1-33%) Exposed Structure Granulation Quality: Pink Fascia Exposed: No Fat Layer (Subcutaneous Tissue) Exposed: Yes Tendon Exposed: No Muscle Exposed: No Joint Exposed: No Bone Exposed: No Electronic  Signature(s) Signed: 12/14/2022 4:44:23 PM By: Linda Bennett, BSN, Bennett, CWS, Linda Bennett, BSN Previous Signature: 12/14/2022 10:41:35 AM Version By: Linda Bennett, BSN, Bennett, CWS, Linda Bennett, BSN Entered By: Linda Bennett, BSN, Bennett, CWS, Linda on 12/14/2022 10:57:17 -------------------------------------------------------------------------------- Vitals Details Patient Name: Date of Service: [address] 12/14/2022 10:15 A M Medical Record Number: [id number] Patient Account Number: [id number] Date of Birth/Sex: Treating Bennett: 1966/01/07 (57 y.o. Female) Linda Bennett Primary Care Imaad Reuss: Linda Bennett Other Clinician: Referring Quill Grinder: Treating Korryn Pancoast/Extender: Linda Bennett in Treatment: 0 Vital Signs Time Taken: 10:15 Temperature (F): 97.6 Height (in): 68 Pulse (bpm): 52 Spaid, Kasiah ([id number]) [id number] Page 7 of 7 Weight (lbs): 230 Respiratory Rate (breaths/min): 18 Body Mass Index (BMI): 35 Blood Pressure (mmHg): 159/84 Reference Range: 80 - 120 mg / dl Electronic Signature(s) Signed: 12/14/2022 10:41:35 AM By: Linda Bennett, BSN, Bennett, CWS, Linda Bennett, BSN Entered By: Linda Bennett, BSN, Bennett, CWS, Linda on 12/14/2022 10:15:46

## 2022-12-14 NOTE — Progress Notes (Signed)
[place], Phineas Semen ([id number]) [phone] [id number] Page 1 of 5 Visit Report for 12/14/2022 Abuse Risk Screen Details Patient Name: Date of Service: Linda, Bennett 12/14/2022 10:15 A M Medical Record Number: [id number] Patient Account Number: [id number] Date of Birth/Sex: Treating RN: 26-Dec-1965 (57 y.o. Linda Bennett Primary Care Kenzi Bardwell: Juluis Pitch Other Clinician: Referring Arwilda [place]: Treating Tarini Carrier/Extender: Abel Presto Weeks in Treatment: 0 Abuse Risk Screen Items Answer ABUSE RISK SCREEN: Has anyone close to you tried to hurt or harm you recentlyo No Do you feel uncomfortable with anyone in your familyo No Has anyone forced you do things that you didnt want to doo No Electronic Signature(s) Signed: 12/14/2022 10:41:35 AM By: Gretta Cool, BSN, RN, CWS, Kim RN, BSN Entered By: Gretta Cool, BSN, RN, CWS, Kim on 12/14/2022 10:21:43 -------------------------------------------------------------------------------- Activities of Daily Living Details Patient Name: Date of Service: Linda, Bennett 12/14/2022 10:15 A M Medical Record Number: [id number] Patient Account Number: [id number] Date of Birth/Sex: Treating RN: May 04, 1966 (57 y.o. Linda Bennett Primary Care Jameela Michna: Juluis Pitch Other Clinician: Referring Koni Kannan: Treating Toneka Fullen/Extender: Abel Presto Weeks in Treatment: 0 Activities of Daily Living Items Answer Activities of Daily Living (Please select one for each item) Drive Automobile Completely Able T Medications ake Completely Able Use T elephone Completely Able Care for Appearance Completely Able Use T oilet Completely Able Bath / Shower Completely Able Dress Self Completely Able Feed Self Completely Able Walk Completely Able Get In / Out Bed Completely Able Housework Completely NANDANA, BIRCH ([id number]) [phone] [id number] Page 2 of 5 Prepare Meals  Completely Able Handle Money Completely Able Shop for Self Completely Able Electronic Signature(s) Signed: 12/14/2022 10:41:35 AM By: Gretta Cool, BSN, RN, CWS, Kim RN, BSN Entered By: Gretta Cool, BSN, RN, CWS, Kim on 12/14/2022 10:21:52 -------------------------------------------------------------------------------- Education Screening Details Patient Name: Date of Service: Linda, Bennett 12/14/2022 10:15 A M Medical Record Number: [id number] Patient Account Number: [id number] Date of Birth/Sex: Treating RN: [birth year] (57 y.o. Linda Bennett Primary Care Jestina Stephani: Juluis Pitch Other Clinician: Referring Caris Cerveny: Treating Cynthya Yam/Extender: Gertie Baron in Treatment: 0 Primary Learner Assessed: Patient Learning Preferences/Education Level/Primary Language Learning Preference: Explanation, Demonstration Highest Education Level: High School Preferred Language: English Cognitive Barrier Language Barrier: No Translator Needed: No Memory Deficit: No Emotional Barrier: No Cultural/Religious Beliefs Affecting Medical Care: No Physical Barrier Impaired Vision: Yes Glasses Impaired Hearing: No Decreased Hand dexterity: No Knowledge/Comprehension Knowledge Level: High Comprehension Level: High Ability to understand written instructions: High Ability to understand verbal instructions: High Motivation Anxiety Level: Calm Cooperation: Cooperative Education Importance: Acknowledges Need Interest in Health Problems: Asks Questions Perception: Coherent Willingness to Engage in Self-Management High Activities: Readiness to Engage in Self-Management High Activities: Electronic Signature(s) Signed: 12/14/2022 10:41:35 AM By: Gretta Cool, BSN, RN, CWS, Kim RN, BSN Entered By: Gretta Cool, BSN, RN, CWS, Kim on 12/14/2022 10:22:23 Linda Bennett ([id number]) [id number] Page 3 of  5 -------------------------------------------------------------------------------- Fall Risk Assessment Details Patient Name: Date of Service: Linda, Bennett 12/14/2022 10:15 A M Medical Record Number: [id number] Patient Account Number: [id number] Date of Birth/Sex: Treating RN: 1966-01-04 (57 y.o. Charolette Forward, Kim Primary Care Lavona Norsworthy: Juluis Pitch Other Clinician: Referring Camari Wisham: Treating Garrus Gauthreaux/Extender: Abel Presto Weeks in Treatment: 0 Fall Risk Assessment Items Have you had 2 or more falls in the last 12 monthso 0 No Have you had any fall that resulted in injury in the last 12 monthso 0 No FALLS RISK SCREEN History of falling - immediate or within 3  months 0 No Secondary diagnosis (Do you have 2 or more medical diagnoseso) 0 No Ambulatory aid None/bed rest/wheelchair/nurse 0 Yes Crutches/cane/walker 0 No Furniture 0 No Intravenous therapy Access/Saline/Heparin Lock 0 No Gait/Transferring Normal/ bed rest/ wheelchair 0 Yes Weak (short steps with or without shuffle, stooped but able to lift head while walking, may seek 0 No support from furniture) Impaired (short steps with shuffle, may have difficulty arising from chair, head down, impaired 0 No balance) Mental Status Oriented to own ability 0 Yes Electronic Signature(s) Signed: 12/14/2022 10:41:35 AM By: Gretta Cool, BSN, RN, CWS, Kim RN, BSN Entered By: Gretta Cool, BSN, RN, CWS, Kim on 12/14/2022 10:22:34 -------------------------------------------------------------------------------- Foot Assessment Details Patient Name: Date of Service: Linda Bennett 12/14/2022 10:15 A M Medical Record Number: [id number] Patient Account Number: [id number] Date of Birth/Sex: Treating RN: 19-Jul-1966 (57 y.o. Linda Bennett Primary Care Jaiyanna Safran: Juluis Pitch Other Clinician: Referring Mattilyn Crites: Treating Markas Aldredge/Extender: Abel Presto Weeks in Treatment: 0 Foot Assessment  Items Site Locations [place], [place] ([id number]) [phone].pdf Page 4 of 5 + = Sensation present, - = Sensation absent, C = Callus, U = Ulcer R = Redness, W = Warmth, M = Maceration, PU = Pre-ulcerative lesion F = Fissure, S = Swelling, D = Dryness Assessment Right: Left: Other Deformity: No No Prior Foot Ulcer: No No Prior Amputation: No No Charcot Joint: No No Ambulatory Status: Ambulatory Without Help Gait: Steady Electronic Signature(s) Signed: 12/14/2022 10:41:35 AM By: Gretta Cool, BSN, RN, CWS, Kim RN, BSN Entered By: Gretta Cool, BSN, RN, CWS, Kim on 12/14/2022 10:22:50 -------------------------------------------------------------------------------- Nutrition Risk Screening Details Patient Name: Date of Service: Linda, Bennett 12/14/2022 10:15 A M Medical Record Number: [id number] Patient Account Number: [id number] Date of Birth/Sex: Treating RN: 10/30/1965 (57 y.o. Charolette Forward, Kim Primary Care Sherra Kimmons: Juluis Pitch Other Clinician: Referring Fronia Depass: Treating Arshan Jabs/Extender: Abel Presto Weeks in Treatment: 0 Height (in): 68 Weight (lbs): 230 Body Mass Index (BMI): 35 Nutrition Risk Screening Items Score Screening NUTRITION RISK SCREEN: I have an illness or condition that made me change the kind and/or amount of food I eat 0 No I eat fewer than two meals per day 0 No I eat few fruits and vegetables, or milk products 0 No I have three or more drinks of beer, liquor or wine almost every day 0 No I have tooth or mouth problems that make it hard for me to eat 0 No I don't always have enough money to buy the food I need 0 No [place], [place] ([id number]) [id number] Page 5 of 5 I eat alone most of the time 0 No I take three or more different prescribed or over-the-counter drugs a day 0 No Without wanting to, I have lost or gained 10 pounds in the last six months 0 No I am not always  physically able to shop, cook and/or feed myself 0 No Nutrition Protocols Good Risk Protocol 0 No interventions needed Moderate Risk Protocol High Risk Proctocol Risk Level: Good Risk Score: 0 Electronic Signature(s) Signed: 12/14/2022 10:41:35 AM By: Gretta Cool, BSN, RN, CWS, Kim RN, BSN Entered By: Gretta Cool, BSN, RN, CWS, Kim on 12/14/2022 10:22:42

## 2022-12-16 ENCOUNTER — Ambulatory Visit: Payer: Medicaid Other | Admitting: Internal Medicine

## 2022-12-23 ENCOUNTER — Ambulatory Visit: Payer: Medicaid Other | Admitting: Internal Medicine

## 2022-12-23 NOTE — Progress Notes (Signed)
 Chief Complaint:   Chief Complaint  Patient presents with  . Mass    Pt c/o a knot under her left arm she states it's like a bone and when she lifts it you can see it better. She felt the pain for a week but didn't feel anything until last night.     Subjective:  Linda Bennett is a 57 y.o. female in today for pain and tenderness in the left neck and left axilla area.  She has frequent musculoskeletal pains; she was here last month with lower back pain, that had affected the right side previously.  She notes that her left neck was sore starting the same time as her lower back pain.  About 1-2 weeks ago, she started having pain/soreness in her left axilla.  Felt a tender knot.  Says it has not changed much since she first noted it.  Hurts to raise her arm, but bothers her at rest and especially at night.  She recently saw Physiatry for the lower back pain and started gabapentin  titration a few days ago.  She had been taking Ibuprofen  for back pain but stopped it when she started gabapentin .  It did not help with axilla pain apparently.  Past Medical History:  Diagnosis Date  . Anxiety   . Arthritis   . Celiac disease   . Depression   . Diabetes mellitus without complication (CMS-HCC)   . GERD (gastroesophageal reflux disease)   . Hyperlipidemia   . Mini stroke 12/01/2021  . Psoriasis   . TIA (transient ischemic attack)   . Vitamin D deficiency    Past Surgical History:  Procedure Laterality Date  . CHOLECYSTECTOMY  02/11/2019  . LIVER BIOPSY  2021  . Colon @ [facility]  08/31/2022   Tubular adenomas/FHx CC/Repeat 68yrs/SMR  . keloid removal      11/2002, 07/2005  . LAPAROSCOPIC TUBAL LIGATION    . MASTECTOMY PARTIAL / LUMPECTOMY Left    lump in left breast  . REPAIR ROTATOR CUFF TEAR ACUTE OPEN Left    Family History  Problem Relation Age of Onset  . Alcohol abuse Mother   . Sudden cardiac death Father   . Alcohol abuse Father   . Bipolar disorder Daughter   . Schizophrenia  Daughter   . Depression Daughter   . Reflux disease Daughter   . Bipolar disorder Daughter   . Depression Daughter   . Anxiety Daughter   . High blood pressure (Hypertension) Daughter   . Pseudotumor cerebri Daughter   . Depression Daughter   . Neuropathy Daughter   . Fibromyalgia Daughter   . Depression Son   . Bipolar disorder Son    Social History   Socioeconomic History  . Marital status: Single  Tobacco Use  . Smoking status: Former    Types: Cigarettes    Start date: 2007    Quit date: 2019    Years since quitting: 5.2  . Smokeless tobacco: Never  Vaping Use  . Vaping Use: Never used  Substance and Sexual Activity  . Alcohol use: No  . Drug use: No  . Sexual activity: Not Currently    Partners: Male   Outpatient Medications Prior to Visit  Medication Sig Dispense Refill  . ACCU-CHEK GUIDE ME GLUCOSE MTR Misc USE AS DIRECTED 1 each 0  . aspirin  325 MG tablet Take 325 mg by mouth once daily    . blood glucose diagnostic test strip 1 each (1 strip total) 3 (three) times daily 100  each 5  . carboxymethylcellulose sodium 0.25 % Administer 1 drop to both eyes Two (2) times a day.    . cholecalciferol (VITAMIN D3) 1000 unit tablet 1,000 Units once daily    . ergocalciferol, vitamin D2, 1,250 mcg (50,000 unit) capsule Take 1 capsule (50,000 Units total) by mouth once a week for 12 doses 12 capsule 0  . FLUoxetine (PROZAC) 20 MG capsule Take 1 capsule (20 mg total) by mouth once daily 90 capsule 1  . gabapentin  (NEURONTIN ) 300 MG capsule 1 po qHS x 4 days, then bid x 4 days, then tid 90 capsule 0  . ibuprofen  (MOTRIN ) 200 MG tablet Take 200 mg by mouth every 8 (eight) hours as needed for Pain    . lancets Use 1 each 3 (three) times daily 100 each 5  . mometasone (ELOCON) 0.1 % ointment Apply topically once daily    . pantoprazole  (PROTONIX ) 20 MG DR tablet Take 1 tablet (20 mg total) by mouth once daily 90 tablet 1  . rosuvastatin (CRESTOR) 40 MG tablet Take 1 tablet (40  mg total) by mouth once daily 90 tablet 1  . TALTZ AUTOINJECTOR 80 mg/mL AtIn monthly     No facility-administered medications prior to visit.   Allergies  Allergen Reactions  . Hydrocodone  Hives and Itching    Other reaction(s): Hives  Other reaction(s): Hives  Other reaction(s): Hives  . Gluten Protein Other (See Comments)    Celiac disease  Other reaction(s): Other (See Comments)  Celiac disease  Other reaction(s): has celiac  Celiac disease  Other reaction(s): Other (See Comments)  Celiac disease  Other reaction(s): has celiac  Celiac disease  Celiac disease  Other reaction(s): Other (See Comments)  Celiac disease  Other reaction(s): has celiac  Celiac disease  Other reaction(s): Other (See Comments)  Celiac disease  Other reaction(s): has celiac  Celiac disease  . Gluten Other (See Comments)    Celiac disease  . Oxycodone  Itching  . Allergenic Extract-Food-Shrimp Itching  . Oxycodone -Acetaminophen  Itching  . Shrimp Hives and Itching  . Tramadol Itching    Objective:   Vitals:   12/23/22 1049  BP: 120/82  Pulse: 53  SpO2: 98%  Weight: (!) 105.5 kg (232 lb 9.6 oz)  PainSc:   6  PainLoc: Arm   Body mass index is 31.77 kg/m.   GENERAL:  Patient is alert and oriented, in no acute distress.  She has pain raising her left arm.  There is a tendon that is very tender to the touch in the axilla.  She has mild tenderness over the left side of the neck.  Assessment/Plan:  Left neck and left axilla pain.  Seems to be part of a more widespread process, possibly related to her psoriatic arthritis.  Start Celebrex 1 tab daily for a couple weeks.  Take Gabapentin  as directed by Physiatry, titrating up the dose every 4 days.  Use heat, or other topicals (e.g., Bio Freeze, Aspercreme) for pain relief of the neck, back or arm area.  Let me know how she's doing in the next couple weeks.  Alm Na, MD, PhD

## 2022-12-30 ENCOUNTER — Ambulatory Visit: Payer: Medicaid Other | Admitting: Internal Medicine

## 2023-02-10 NOTE — Therapy (Signed)
OUTPATIENT PHYSICAL THERAPY EVALUATION   Patient Name: Linda Bennett MRN: [id number] DOB:April 23, 1966, 57 y.o., female Today's Date: 02/16/2023  END OF SESSION:  PT End of Session - 02/16/23 1304     Visit Number 1    Number of Visits 13    Date for PT Re-Evaluation 05/11/23    Authorization Type [place] [employer] reporting period from 02/16/2023    Authorization - Visit Number 1    Authorization - Number of Visits 1    Progress Note Due on Visit 10    PT Start Time 0950    PT Stop Time 1030    PT Time Calculation (min) 40 min    Activity Tolerance Patient tolerated treatment well    Behavior During Therapy WFL for tasks assessed/performed             Past Medical History:  Diagnosis Date   Celiac disease    Hidradenitis suppurativa    High cholesterol    Psoriasis    Psoriatic arthritis (HCC)    Rheumatoid arthritis (HCC)    Stroke (HCC)    Type 2 diabetes mellitus (HCC)    Vitamin D deficiency    Past Surgical History:  Procedure Laterality Date   BACK SURGERY     BREAST BIOPSY Left 2019   left br bx for papilloma removed   CHOLECYSTECTOMY N/A 02/25/2019   Procedure: LAPAROSCOPIC CHOLECYSTECTOMY;  Surgeon: Leafy Ro, MD;  Location: [facility] ORS;  Service: General;  Laterality: N/A;   COLONOSCOPY WITH PROPOFOL N/A 08/31/2022   Procedure: COLONOSCOPY WITH PROPOFOL;  Surgeon: Jaynie Collins, DO;  Location: [facility] ENDOSCOPY;  Service: Gastroenterology;  Laterality: N/A;   LIVER BIOPSY     SHOULDER SURGERY     TONSILLECTOMY     TUBAL LIGATION     Patient Active Problem List   Diagnosis Date Noted   Stroke-like symptom 12/01/2021   Chest pain 12/01/2021   HLD (hyperlipidemia) 12/01/2021   Tobacco use 12/01/2021   GERD (gastroesophageal reflux disease) 12/01/2021   Sinus bradycardia 12/01/2021   Cholecystitis 02/25/2019    PCP: Dorothey Baseman, MD  REFERRING PROVIDER: Elijah Birk, MD  REFERRING DIAG: cervical radiculitis, lumbar  radiculitis  Rationale for Evaluation and Treatment: Rehabilitation  THERAPY DIAG:  Radiculopathy, lumbar region  Other low back pain  Radiculopathy, cervical region  Cervicalgia  ONSET DATE: March 2024 for no apparent reason  SUBJECTIVE:                                                                                                                                                                                           SUBJECTIVE  STATEMENT: Patient states she has neck and back pain but her neck pain has improved and she would like to focus today's session on her back pain. Her back and neck pain started for no apparent reason in March 2024. It started with her neck on the left side. The she started having pain in her back on her right side, then it went to her left side, then went back to the right side. After 30 days she went to the doctor about it because she was not getting better it was getting worse. She states she had pain down the back of her thigh to her calf previously, but not any longer. She gets pain in the front of either thigh above the knee, not at the same time and described as an aching. Her pain is currently across the low back when it occurs. Her back pain comes and goes. Denies numbness or tingling now or before. She did feel like she had weakness in her left leg, but that is better now. She feels overall her back is staying the same after the initial improvement she experienced. She states there was a brief time where she had pain over her privates, but that has improved and she never had numbness there. Patient describes some urinary incontinence last year that includes difficulty getting to the bathroom on time and leaking with coughing, laughing, and sneezing. She denies retention or being unaware of incontinence. She states she current must get up every 2 hours each night to use the bathroom, but no longer must use a brief.  Patient pain over the private area.   Used  to exercises 1-2x a week with [employer]. She has not had any other treatment for her back and neck besides taking ibuprofen (helps).    PERTINENT HISTORY:  Patient is a 57 y.o. female who presents to outpatient physical therapy with a referral for medical diagnosis cervical radiculitis, lumbar radiculitis. This patient's chief complaints consist of left sided neck pain and low back pain that used to radiated down to left forearm and left lower leg leading to the following functional deficits: difficulty with usual activities such as grocery shopping, and standing a long time, walking a long time, running, playing with grandchildren, lifting heavy items, moving furniture, walking more than a mile, prolonged sitting, driving, turning her head. 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  Patient denies hx of cancer, seizures, lung problems, heart problems, unexplained changes in bowel or bladder problems, and spinal surgery. She does state her weight changes without knowing why over the last 10 years. Doctors know about it and they don't know why. She does endorse unexplained dropping things and stumbling, osteoporosis.   PAIN:    P1: low back Yes: NPRS scale: Current: 2/10,  Best: 1/10, Worst: 9/10. Pain location: across low back, previously went down left posterior thigh to ankle. Occasionally goes to bilateral anterior thighs above knee - achy.  Pain description: anterior thighs achy, achy in back.  Aggravating factors: prolonged sitting,  prolonged standing, prolonged walking, sometimes nothing, waking up in the middle of the night.  Relieving factors: ibuprofen, rest, heating pad.   P2: neck Yes: NPRS  scale: Current: 3/10,  Best: 1/10, Worst: 8/10. Pain location: left neck. Has gone to left ear and numbness to her forearm.  Pain description: numb, achy  Aggravating factors: turning head  Relieving factors: ibuprofen, rest, heating pad  FUNCTIONAL LIMITATIONS: difficulty with usual activities such as grocery shopping, and standing a long time, walking a long time, running, playing with grandchildren, lifting heavy items, moving furniture, walking more than a mile, prolonged sitting, driving, turning her head.   LEISURE: watch TV, go pick up daughter to go to lunch, visit her daughter  PRECAUTIONS: None  WEIGHT BEARING RESTRICTIONS: No  FALLS:  Has patient fallen in last 6 months? No Sometimes worried about falling.   LIVING ENVIRONMENT: Lives with: alone Lives in: an apartment Stairs: 2 steps to enter with no handrail; 14 steps to bedroom with handrail on the left when going up.  Has following equipment at home: Single point cane, but she only used it once due to her back pain a couple of months ago because she had to walk so far to her appointment.   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   PLOF: Independent  PATIENT GOALS: "less pain"   OBJECTIVE:   EXAMINATION FOCUSED ON LOW BACK, CERVICAL SPINE EXAM DEFERRED TO LATER DATE AS NEEDED  DIAGNOSTIC FINDINGS:  Per documentation from Filomena Jungling, MD 12/16/2022:  X-ray lumbar spine 11/2022: Mild degenerative changes lower lumbar spine  X-ray sacroiliac joints 11/2022: Normal appearance of SI joints   SELF- REPORTED FUNCTION FOTO score: 57/100 (lumbar spine questionnaire)  OBSERVATION/INSPECTION Posture Posture (seated): forward head, rounded shoulders, slumped in sitting.  Posture (standing): forward head, increased thoracic kyphosis,  increased lumbar lordosis, stands with less weight on left LE, right shoulder lower than left.  Anthropometrics Tremor: none Body composition: BMI 34.5  Functional Mobility Bed mobility: supine <> sit and rolling WFL Transfers: sit <> stand WFL Gait: grossly WFL for household and short community ambulation. More detailed gait analysis deferred to later date as needed.   SPINE MOTION CERVICAL SPINE AROM Rotation screen: limited bilaterally with concordant pain.   LUMBAR SPINE AROM *Indicates pain Flexion: fingers to ankles, aberrant movement to return, concordant pain at low back.  Extension: 50% concordant back pain Side Flexion:   R finger to mid patella  L finger proximal patella, increased concordant left low back pain.  Rotation:  R 75% L 75% increased concordant back pain on left.   NEUROLOGICAL Dermatomes L2-S2 appears equal and intact to light touch.   PERIPHERAL JOINT MOTION (in degrees PASSIVE RANGE OF MOTION (PROM) Comments:  02/16/23: B hip, knees, and ankles, WFL for basic mobility except lacks B hip extension, lateral hip pain with B IR. Hamstring length limited to approx 45 degrees SLR.   MUSCLE PERFORMANCE (MMT):  *Indicates pain 02/16/23 Date Date  Joint/Motion R/L R/L R/L  Hip     Flexion (L1, L2) 5/5 / /  Extension (knee ext) / / /  Extension (knee flex) / / /  Abduction 5/4+* / /  Comments:  02/16/2023: B knees and ankles grossly 5/5. Able to toe and heel walk with B UE support.  Blank means not tested.  SPECIAL TESTS: LOWER LIMB NEURODYNAMIC TESTS Straight Leg Raise (Sciatic nerve)  R  = negative  L  = negative  ACCESSORY MOTION: Deferred  PALPATION: Deferred   TODAY'S TREATMENT:  education  PATIENT EDUCATION:  Education details: Education on diagnosis, prognosis, POC, anatomy and physiology of current condition.   Person educated:  Patient Education method: Explanation Education comprehension: verbalized understanding, needs  reinforcement.   HOME EXERCISE PROGRAM: TBD  ASSESSMENT:  CLINICAL IMPRESSION: Patient is a 57 y.o. female referred to outpatient physical therapy with a medical diagnosis of cervical radiculitis, lumbar radiculitis who presents with signs and symptoms consistent with subacute and improving cervicalgia and low back pain with history of L cervical radiculopathy and left lumbar radiculopathy. Today's exam focused on low back pain with cervical spine pain to be further examined at later date as appropriate. Patient presents with significant pain, ROM, flexibility, postural, joint stiffness, muscle performance (strength/power/endurance), and activity tolerance impairments that are limiting ability to complete her usual activities such as grocery shopping, and standing a long time, walking a long time, running, playing with grandchildren, lifting heavy items, moving furniture, walking more than a mile, prolonged sitting, driving, turning her head without difficulty. Patient will benefit from skilled physical therapy intervention to address current body structure impairments and activity limitations to improve function and work towards goals set in current POC in order to return to prior level of function or maximal functional improvement.   OBJECTIVE IMPAIRMENTS: decreased activity tolerance, decreased endurance, decreased knowledge of condition, decreased mobility, difficulty walking, decreased ROM, decreased strength, impaired perceived functional ability, impaired UE functional use, improper body mechanics, postural dysfunction, obesity, and pain.   ACTIVITY LIMITATIONS: carrying, lifting, bending, sitting, standing, squatting, sleeping, stairs, continence, bathing, dressing, and locomotion level  PARTICIPATION LIMITATIONS: meal prep, cleaning, laundry, shopping, community activity, occupation, and   difficulty with usual activities such as grocery shopping, and standing a long time, walking a long time,  running, playing with grandchildren, lifting heavy items, moving furniture, walking more than a mile, prolonged sitting, driving, turning her head  PERSONAL FACTORS: Age, Fitness, Past/current experiences, and 3+ comorbidities:   anxiety, arthritis, celiac disease, depression, diabetes mellitus, GERD, hyperlipidemia, TIA (12/01/21), psoriasis, cholecystectomy (2020), liver biopsy 2021, 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   REHAB POTENTIAL: Good  CLINICAL DECISION MAKING: Evolving/moderate complexity  EVALUATION COMPLEXITY: Moderate   GOALS: Goals reviewed with patient? No  SHORT TERM GOALS: Target date: 03/02/2023  Patient will be independent with initial home exercise program for self-management of symptoms. Baseline: Initial HEP to be provided at visit 2 as appropriate (02/16/23); Goal status: INITIAL   LONG TERM GOALS: Target date: 05/11/2023  Patient will be independent with a long-term home exercise program for self-management of symptoms.  Baseline: Initial HEP to be provided at visit 2 as appropriate (02/16/23); Goal status: INITIAL  2.  Patient will demonstrate improved FOTO to equal or greater than 63 by visit #10 to demonstrate improvement in overall condition and self-reported functional ability.  Baseline: 57 (02/16/23); Goal status: INITIAL  3.  Patient will demonstrate 25% improvement in lumbar AROM to improve her ability to complete activities such as work with less difficulty.  Baseline: limited and painful - see above (02/16/23); Goal status: INITIAL  4.  Patient will demonstrate ability to rotation cervical spine equal or greater than 60 degrees to improve ability to  check blind spot while driving. Baseline: grossly limited per screen (02/16/23); Goal status: INITIAL  5.  Patient will complete community, work and/or recreational activities with 50% less limitation due to current condition.  Baseline: difficulty with usual activities such  as grocery shopping, and standing a long time, walking a long time, running, playing with grandchildren, lifting heavy items, moving furniture, walking more than a mile, prolonged sitting, driving, turning her head (02/16/23); Goal status: INITIAL  6.  Patient will report highest level of pain with functional activities equal or less than 3/10 to improve her ability to engage in functional activities such as work and leisure.  Baseline: up to 9/10 (02/16/2023);  Goal status: INITIAL  PLAN:  PT FREQUENCY: 1-2x/week  PT DURATION: 12 weeks  PLANNED INTERVENTIONS: Therapeutic exercises, Therapeutic activity, Neuromuscular re-education, Balance training, Patient/Family education, Self Care, Joint mobilization, Stair training, DME instructions, Dry Needling, Electrical stimulation, Spinal mobilization, Cryotherapy, Moist heat, Manual therapy, and Re-evaluation.  PLAN FOR NEXT SESSION: update HEP as appropriate, progressive core/LE/functional strengthening and ROM exercises. Neurodynamic techniques as appropriate, education, manual therapy as needed. Further exam and treatment of neck pain as appropriate.    Cira Rue, PT, DPT 02/16/2023, 2:15 PM    [facility] [address] [place], [place] [address] P: [phone] I F: [phone]

## 2023-02-16 ENCOUNTER — Ambulatory Visit: Payer: Medicaid Other | Attending: Physical Medicine & Rehabilitation | Admitting: Physical Therapy

## 2023-02-16 ENCOUNTER — Encounter: Payer: Self-pay | Admitting: Physical Therapy

## 2023-02-16 DIAGNOSIS — M5416 Radiculopathy, lumbar region: Secondary | ICD-10-CM | POA: Insufficient documentation

## 2023-02-16 DIAGNOSIS — M5412 Radiculopathy, cervical region: Secondary | ICD-10-CM | POA: Insufficient documentation

## 2023-02-16 DIAGNOSIS — M542 Cervicalgia: Secondary | ICD-10-CM | POA: Diagnosis present

## 2023-02-16 DIAGNOSIS — M5459 Other low back pain: Secondary | ICD-10-CM | POA: Diagnosis present

## 2023-02-23 ENCOUNTER — Encounter: Payer: Self-pay | Admitting: Physical Therapy

## 2023-02-23 ENCOUNTER — Ambulatory Visit: Payer: Medicaid Other | Admitting: Physical Therapy

## 2023-02-23 VITALS — BP 128/60 | HR 53

## 2023-02-23 DIAGNOSIS — M5416 Radiculopathy, lumbar region: Secondary | ICD-10-CM | POA: Diagnosis not present

## 2023-02-23 DIAGNOSIS — M542 Cervicalgia: Secondary | ICD-10-CM

## 2023-02-23 DIAGNOSIS — M5459 Other low back pain: Secondary | ICD-10-CM

## 2023-02-23 DIAGNOSIS — M5412 Radiculopathy, cervical region: Secondary | ICD-10-CM

## 2023-02-23 NOTE — Therapy (Signed)
OUTPATIENT PHYSICAL THERAPY TREATMENT NOTE   Patient Name: Linda Bennett MRN: [id number] DOB:[birth year], 57 y.o., female Today's Date: 02/23/2023  END OF SESSION:  PT End of Session - 02/23/23 0952     Visit Number 2    Number of Visits 13    Date for PT Re-Evaluation 05/11/23    Authorization Type  [employer] reporting period from 02/16/2023    Authorization Time Period [facility] [facility] #[id number] 5/21-7/20 for 10 PT visits    Authorization - Visit Number 1    Authorization - Number of Visits 10    Progress Note Due on Visit 10    PT Start Time 0946    PT Stop Time 1025    PT Time Calculation (min) 39 min    Activity Tolerance Patient tolerated treatment well    Behavior During Therapy WFL for tasks assessed/performed              Past Medical History:  Diagnosis Date   Celiac disease    Hidradenitis suppurativa    High cholesterol    Psoriasis    Psoriatic arthritis (HCC)    Rheumatoid arthritis (HCC)    Stroke (HCC)    Type 2 diabetes mellitus (HCC)    Vitamin D deficiency    Past Surgical History:  Procedure Laterality Date   BACK SURGERY     BREAST BIOPSY Left 2019   left br bx for papilloma removed   CHOLECYSTECTOMY N/A 02/25/2019   Procedure: LAPAROSCOPIC CHOLECYSTECTOMY;  Surgeon: Leafy Ro, MD;  Location: [facility];  Service: General;  Laterality: N/A;   COLONOSCOPY WITH PROPOFOL N/A 08/31/2022   Procedure: COLONOSCOPY WITH PROPOFOL;  Surgeon: Jaynie Collins, DO;  Location: [facility];  Service: Gastroenterology;  Laterality: N/A;   LIVER BIOPSY     SHOULDER SURGERY     TONSILLECTOMY     TUBAL LIGATION     Patient Active Problem List   Diagnosis Date Noted   Stroke-like symptom 12/01/2021   Chest pain 12/01/2021   HLD (hyperlipidemia) 12/01/2021   Tobacco use 12/01/2021   GERD (gastroesophageal reflux disease) 12/01/2021   Sinus bradycardia 12/01/2021   Cholecystitis 02/25/2019    PCP: Dorothey Baseman,  MD  REFERRING PROVIDER: Elijah Birk, MD  REFERRING DIAG: cervical radiculitis, lumbar radiculitis  Rationale for Evaluation and Treatment: Rehabilitation  THERAPY DIAG:  Radiculopathy, lumbar region  Other low back pain  Radiculopathy, cervical region  Cervicalgia  ONSET DATE: March 2024 for no apparent reason  PERTINENT HISTORY:  Patient is a 57 y.o. female who presents to outpatient physical therapy with a referral for medical diagnosis cervical radiculitis, lumbar radiculitis. This patient's chief complaints consist of left sided neck pain and low back pain that used to radiated down to left forearm and left lower leg leading to the following functional deficits: difficulty with usual activities such as grocery shopping, and standing a long time, walking a long time, running, playing with grandchildren, lifting heavy items, moving furniture, walking more than a mile, prolonged sitting, driving, turning her head. 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  Patient denies hx of cancer, seizures, lung problems, heart problems, unexplained changes in bowel or  bladder problems, and spinal surgery. She does state her weight changes without knowing why over the last 10 years. Doctors know about it and they don't know why. She does endorse unexplained dropping things and stumbling, osteoporosis.   SUBJECTIVE:                                                                                                                                                                                            SUBJECTIVE STATEMENT: Patient reports she has been "off and on" since her initial evaluation. She states her back pain is on the left side today. Friday and Saturday it took her a long time to stand up because her back pain.   PAIN:  NPRS: 7/10 in left low back. 4/10 left neck.   PRECAUTIONS: None   PATIENT GOALS: "less pain"   OBJECTIVE:   Vitals:   02/23/23 0953  BP: 128/60  Pulse: (!) 53  SpO2: 99%   ACCESSORY MOTION: Tender with reproduction of pain at approx L4-L5   PALPATION: TTP at left lumbar paraspinals near L4-L5  MUSCLE PERFORMANCE (MMT):  *Indicates pain 02/16/23 02/23/23 Date  Joint/Motion R/L R/L R/L  Hip        Flexion (L1, L2) 5/5 / /  Extension (knee flex) 4/4* / /  Abduction 5/4+* / /  Comments:  02/16/2023: B knees and ankles grossly 5/5. Able to toe and heel walk with B UE support.  Blank means not tested. 02/23/2023: hip not fully extending.   TODAY'S TREATMENT:  Therapeutic exercise: to centralize symptoms and improve ROM, strength, muscular endurance, and activity tolerance required for successful completion of functional activities.  - vitals measurement to assess baseline (see above).  - further exam to assess baseline (see above) - hooklying low trunk rotation, 1x10 each side - hooklying anterior and posterior pelvic tilt, AROM, many reps completed with multiple pauses to help teach technique and educate patient on body region and movement.  - half hooklying curl up with posterior pelvic tilt, 1x15 seconds, 4x30 seconds.  - Education on HEP including handout   Manual therapy: to reduce pain and tissue tension, improve range of motion, neuromodulation, in order to promote improved ability to complete functional activities. PRONE - STM to B lower thoracic and lumbar paraspinals and QL. STM to bilateral glutes.  - CPA grade III to mid thoracic through sacral segments of spine.   Pt required  multimodal cuing for proper technique and to facilitate improved neuromuscular control, strength, range of motion, and functional ability resulting in improved performance and form.   PATIENT EDUCATION:  Education details: Exercise purpose/form. Self management techniques. Education  on diagnosis, prognosis, POC, anatomy and physiology of current condition. Education on HEP including handout. Reviewed cancelation/no-show policy with patient and confirmed patient has correct phone number for clinic; patient verbalized understanding (02/23/23). Person educated: Patient Education method: Explanation, Demonstration, Tactile cues, Verbal cues, and Handouts Education comprehension: verbalized understanding, needs reinforcement.   HOME EXERCISE PROGRAM: Access Code: [email or web address] URL: [email or web address] Date: 02/23/2023 Prepared by: Norton Blizzard  Exercises - Supine Lower Trunk Rotation  - 1 x daily - 20 reps - Supine Pelvic Tilt  - 1 x daily - 1 sets - 20 reps - Neutral Curl Up with Arms Across Chest  - 1 x daily - 5 reps - 30 seoncds hold  ASSESSMENT:  CLINICAL IMPRESSION: Patient arrives reporting minimal change from initial evaluation. Today's session focused on teaching patient initial awareness and control of the lumbopelvic region and establishing initial HEP to help her improve her strength, motor control, and awareness of this area. She had pain with bed mobility that improved with abdominal brace before moving. She seemed sensitive to lumbar extension and was TTP and had painful CPA at lowest segments of lumbar spine. She needed extensive cuing and education to start learning how to control the lumbopelvic region. Plan to build on what she learned today and incorporate more movement into her session to help keep her from getting stiff and sore on her back as she did today. Patient would benefit from continued management of limiting condition by skilled physical therapist to  address remaining impairments and functional limitations to work towards stated goals and return to PLOF or maximal functional independence.   From initial PT evaluation 02/16/2023:  Patient is a 57 y.o. female referred to outpatient physical therapy with a medical diagnosis of cervical radiculitis, lumbar radiculitis who presents with signs and symptoms consistent with subacute and improving cervicalgia and low back pain with history of L cervical radiculopathy and left lumbar radiculopathy. Today's exam focused on low back pain with cervical spine pain to be further examined at later date as appropriate. Patient presents with significant pain, ROM, flexibility, postural, joint stiffness, muscle performance (strength/power/endurance), and activity tolerance impairments that are limiting ability to complete her usual activities such as grocery shopping, and standing a long time, walking a long time, running, playing with grandchildren, lifting heavy items, moving furniture, walking more than a mile, prolonged sitting, driving, turning her head without difficulty. Patient will benefit from skilled physical therapy intervention to address current body structure impairments and activity limitations to improve function and work towards goals set in current POC in order to return to prior level of function or maximal functional improvement.   OBJECTIVE IMPAIRMENTS: decreased activity tolerance, decreased endurance, decreased knowledge of condition, decreased mobility, difficulty walking, decreased ROM, decreased strength, impaired perceived functional ability, impaired UE functional use, improper body mechanics, postural dysfunction, obesity, and pain.   ACTIVITY LIMITATIONS: carrying, lifting, bending, sitting, standing, squatting, sleeping, stairs, continence, bathing, dressing, and locomotion level  PARTICIPATION LIMITATIONS: meal prep, cleaning, laundry, shopping, community activity, occupation, and    difficulty with usual activities such as grocery shopping, and standing a long time, walking a long time, running, playing with grandchildren, lifting heavy items, moving furniture, walking more than a mile, prolonged sitting, driving, turning her head  PERSONAL FACTORS: Age, Fitness, Past/current experiences, and 3+ comorbidities:   anxiety, arthritis, celiac disease, depression, diabetes mellitus, GERD, hyperlipidemia, TIA (12/01/21), psoriasis, cholecystectomy (2020), liver biopsy 2021, 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   REHAB POTENTIAL: Good  CLINICAL DECISION MAKING: Evolving/moderate complexity  EVALUATION COMPLEXITY: Moderate   GOALS: Goals reviewed with patient? No  SHORT TERM GOALS: Target date: 03/02/2023  Patient will be independent with initial home exercise program for self-management of symptoms. Baseline: Initial HEP to be provided at visit 2 as appropriate (02/16/23); Goal status: in progress   LONG TERM GOALS: Target date: 05/11/2023  Patient will be independent with a long-term home exercise program for self-management of symptoms.  Baseline: Initial HEP to be provided at visit 2 as appropriate (02/16/23); Goal status: In-progress  2.  Patient will demonstrate improved FOTO to equal or greater than 63 by visit #10 to demonstrate improvement in overall condition and self-reported functional ability.  Baseline: 57 (02/16/23); Goal status: In-progress  3.  Patient will demonstrate 25% improvement in lumbar AROM to improve her ability to complete activities such as work with less difficulty.  Baseline: limited and painful - see above (02/16/23); Goal status:  In-progress  4.  Patient will demonstrate ability to rotation cervical spine equal or greater than 60 degrees to improve ability to check blind spot while driving. Baseline: grossly limited per screen (02/16/23); Goal status: In-progress  5.  Patient will complete community, work and/or recreational activities with 50% less limitation due to current condition.  Baseline: difficulty with usual activities such as grocery shopping, and standing a long time, walking a long time, running, playing with grandchildren, lifting heavy items, moving furniture, walking more than a mile, prolonged sitting, driving, turning her head (02/16/23); Goal status: In-progress  6.  Patient will report highest level of pain with functional activities equal or less than 3/10 to improve her ability to engage in functional activities such as work and leisure.  Baseline: up to 9/10 (02/16/2023);  Goal status: In-progress  PLAN:  PT FREQUENCY: 1-2x/week  PT DURATION: 12 weeks  PLANNED INTERVENTIONS: Therapeutic exercises, Therapeutic activity, Neuromuscular re-education, Balance training, Patient/Family education, Self Care, Joint mobilization, Stair training, DME instructions, Dry Needling, Electrical stimulation, Spinal mobilization, Cryotherapy, Moist heat, Manual therapy, and Re-evaluation.  PLAN FOR NEXT SESSION: update HEP as appropriate, progressive core/LE/functional strengthening and ROM exercises. Neurodynamic techniques as appropriate, education, manual therapy as needed. Further exam and treatment of neck pain as appropriate.    Cira Rue, PT, DPT 02/23/2023, 2:24 PM    [facility] [address] [place], [place] [address] P: [phone] I F: [phone]

## 2023-02-25 ENCOUNTER — Ambulatory Visit: Payer: Medicaid Other | Admitting: Physical Therapy

## 2023-02-25 ENCOUNTER — Encounter: Payer: Self-pay | Admitting: Physical Therapy

## 2023-02-25 DIAGNOSIS — M5412 Radiculopathy, cervical region: Secondary | ICD-10-CM

## 2023-02-25 DIAGNOSIS — M542 Cervicalgia: Secondary | ICD-10-CM

## 2023-02-25 DIAGNOSIS — M5416 Radiculopathy, lumbar region: Secondary | ICD-10-CM

## 2023-02-25 DIAGNOSIS — M5459 Other low back pain: Secondary | ICD-10-CM

## 2023-02-25 NOTE — Therapy (Signed)
OUTPATIENT PHYSICAL THERAPY TREATMENT NOTE   Patient Name: Declan Kinstler MRN: [id number] DOB:1966/03/29, 57 y.o., female Today's Date: 02/25/2023  END OF SESSION:  PT End of Session - 02/25/23 1746     Visit Number 3    Number of Visits 13    Date for PT Re-Evaluation 05/11/23    Authorization Type [place] [employer] reporting period from 02/16/2023    Authorization Time Period [employer] [facility] #[id number] 5/21-7/20 for 10 PT visits    Authorization - Number of Visits 10    Progress Note Due on Visit 10    PT Start Time 0950    PT Stop Time 1030    PT Time Calculation (min) 40 min    Activity Tolerance Patient tolerated treatment well    Behavior During Therapy WFL for tasks assessed/performed               Past Medical History:  Diagnosis Date   Celiac disease    Hidradenitis suppurativa    High cholesterol    Psoriasis    Psoriatic arthritis (HCC)    Rheumatoid arthritis (HCC)    Stroke (HCC)    Type 2 diabetes mellitus (HCC)    Vitamin D deficiency    Past Surgical History:  Procedure Laterality Date   BACK SURGERY     BREAST BIOPSY Left 2019   left br bx for papilloma removed   CHOLECYSTECTOMY N/A 02/25/2019   Procedure: LAPAROSCOPIC CHOLECYSTECTOMY;  Surgeon: Leafy Ro, MD;  Location: [facility];  Service: General;  Laterality: N/A;   COLONOSCOPY WITH PROPOFOL N/A 08/31/2022   Procedure: COLONOSCOPY WITH PROPOFOL;  Surgeon: Jaynie Collins, DO;  Location: [facility];  Service: Gastroenterology;  Laterality: N/A;   LIVER BIOPSY     SHOULDER SURGERY     TONSILLECTOMY     TUBAL LIGATION     Patient Active Problem List   Diagnosis Date Noted   Stroke-like symptom 12/01/2021   Chest pain 12/01/2021   HLD (hyperlipidemia) 12/01/2021   Tobacco use 12/01/2021   GERD (gastroesophageal reflux disease) 12/01/2021   Sinus bradycardia 12/01/2021   Cholecystitis 02/25/2019    PCP: Dorothey Baseman, MD  REFERRING PROVIDER: Elijah Birk,  MD  REFERRING DIAG: cervical radiculitis, lumbar radiculitis  Rationale for Evaluation and Treatment: Rehabilitation  THERAPY DIAG:  Radiculopathy, lumbar region  Other low back pain  Radiculopathy, cervical region  Cervicalgia  ONSET DATE: March 2024 for no apparent reason  PERTINENT HISTORY:  Patient is a 57 y.o. female who presents to outpatient physical therapy with a referral for medical diagnosis cervical radiculitis, lumbar radiculitis. This patient's chief complaints consist of left sided neck pain and low back pain that used to radiated down to left forearm and left lower leg leading to the following functional deficits: difficulty with usual activities such as grocery shopping, and standing a long time, walking a long time, running, playing with grandchildren, lifting heavy items, moving furniture, walking more than a mile, prolonged sitting, driving, turning her head. 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  Patient denies hx of cancer, seizures, lung problems, heart problems, unexplained changes in bowel or bladder problems, and spinal surgery. She does  state her weight changes without knowing why over the last 10 years. Doctors know about it and they don't know why. She does endorse unexplained dropping things and stumbling, osteoporosis.   SUBJECTIVE:                                                                                                                                                                                            SUBJECTIVE STATEMENT: Patient reports she did her HEP but forgot her paper so she looked up some hip exercises on youtube instead of the handout. She was really happy because she was not as stiff when she got up. She has been excited and doing more exercises that she has found. She states it is much easier to get out of bed and up from a chair now.   PAIN:  NPRS: 7/10 in right low back to R ankle.    PRECAUTIONS: None   PATIENT GOALS: "less pain"   OBJECTIVE:   TODAY'S TREATMENT:  Therapeutic exercise: to centralize symptoms and improve ROM, strength, muscular endurance, and activity tolerance required for successful completion of functional activities.  - NuStep level 1 using bilateral upper and lower extremities. Seat/handle setting 10/10. For improved extremity mobility, muscular endurance, and activity tolerance; and to induce the analgesic effect of aerobic exercise, stimulate improved joint nutrition, and prepare body structures and systems for following interventions. x 5  minutes. Average SPM = 80. - seated sciatic nerve glide, slider technique, 1x15, 1x10 each side.  - hooklying low trunk rotation, 1x20 each side - hooklying anterior and posterior pelvic tilt, AROM, 1x20 - half hooklying curl up with posterior pelvic tilt, 2x30 seconds.  - hooklying curl up with B LE in table top, 5x30 seconds with heavy cuing for sequencing to get posterior pelvic tilt and TrA contraction hold.  - quadruped bird dog, 3x7 each side - child's pose with knees out, rest between bird dog sets.  - Education on HEP including handout    Pt required multimodal cuing for proper technique and to facilitate improved neuromuscular control, strength, range of motion, and functional ability resulting in improved performance and form.   PATIENT EDUCATION:  Education details: Exercise purpose/form. Self management techniques. Education on diagnosis, prognosis, POC, anatomy and physiology of current  condition. Education on HEP including handout. Reviewed cancelation/no-show policy with patient and confirmed patient has correct phone number for clinic; patient verbalized understanding (02/23/23). Person educated: Patient Education method: Explanation, Demonstration, Tactile cues, Verbal cues, and Handouts Education comprehension: verbalized understanding, needs reinforcement.   HOME EXERCISE PROGRAM: Access Code: [email or web address] URL: [email or web address] Date: 02/25/2023 Prepared by: Norton Blizzard  Exercises -  Seated Sciatic Tensioner  - 2 x daily - 1 sets - 15 reps - Supine Lower Trunk Rotation  - 1 x daily - 20 reps - Supine Pelvic Tilt  - 1 x daily - 1 sets - 20 reps - The Hundred 3 Intermediate - Table Top  - 1 x daily - 5 reps - 30 seconds hold - Bird Dog  - 1 x daily - 2 sets - 7 reps - Child's Pose Knees Apart and Hands Forward    ASSESSMENT:  CLINICAL IMPRESSION: Patient arrives reporting shift of pain from left to right side and down right leg. However, she also reports her function has gotten a lot better since doing exercises from HEP and youtube. Today's session focused on progressing abdominal and lumbopelvic control exercises as well as neurodynamic exercises. Patient demonstrated improving lumbopelvic control but continued to require a lot of cuing. She reported resolution of pain by end of session. Patient would benefit from continued management of limiting condition by skilled physical therapist to address remaining impairments and functional limitations to work towards stated goals and return to PLOF or maximal functional independence.   From initial PT evaluation 02/16/2023:  Patient is a 57 y.o. female referred to outpatient physical therapy with a medical diagnosis of cervical radiculitis, lumbar radiculitis who presents with signs and symptoms consistent with subacute and improving cervicalgia and low back pain with history of L cervical radiculopathy and left  lumbar radiculopathy. Today's exam focused on low back pain with cervical spine pain to be further examined at later date as appropriate. Patient presents with significant pain, ROM, flexibility, postural, joint stiffness, muscle performance (strength/power/endurance), and activity tolerance impairments that are limiting ability to complete her usual activities such as grocery shopping, and standing a long time, walking a long time, running, playing with grandchildren, lifting heavy items, moving furniture, walking more than a mile, prolonged sitting, driving, turning her head without difficulty. Patient will benefit from skilled physical therapy intervention to address current body structure impairments and activity limitations to improve function and work towards goals set in current POC in order to return to prior level of function or maximal functional improvement.   OBJECTIVE IMPAIRMENTS: decreased activity tolerance, decreased endurance, decreased knowledge of condition, decreased mobility, difficulty walking, decreased ROM, decreased strength, impaired perceived functional ability, impaired UE functional use, improper body mechanics, postural dysfunction, obesity, and pain.   ACTIVITY LIMITATIONS: carrying, lifting, bending, sitting, standing, squatting, sleeping, stairs, continence, bathing, dressing, and locomotion level  PARTICIPATION LIMITATIONS: meal prep, cleaning, laundry, shopping, community activity, occupation, and   difficulty with usual activities such as grocery shopping, and standing a long time, walking a long time, running, playing with grandchildren, lifting heavy items, moving furniture, walking more than a mile, prolonged sitting, driving, turning her head  PERSONAL FACTORS: Age, Fitness, Past/current experiences, and 3+ comorbidities:   anxiety, arthritis, celiac disease, depression, diabetes mellitus, GERD, hyperlipidemia, TIA (12/01/21), psoriasis, cholecystectomy (2020), liver  biopsy 2021, 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   REHAB POTENTIAL: Good  CLINICAL DECISION MAKING: Evolving/moderate complexity  EVALUATION COMPLEXITY: Moderate   GOALS: Goals reviewed with patient? No  SHORT TERM GOALS: Target date: 03/02/2023  Patient will be independent with initial home exercise program for self-management of symptoms. Baseline: Initial HEP to be  provided at visit 2 as appropriate (02/16/23); Goal status: in progress   LONG TERM GOALS: Target date: 05/11/2023  Patient will be independent with a long-term home exercise program for self-management of symptoms.  Baseline: Initial HEP to be provided at visit 2 as appropriate (02/16/23); Goal status: In-progress  2.  Patient will demonstrate improved FOTO to equal or greater than 63 by visit #10 to demonstrate improvement in overall condition and self-reported functional ability.  Baseline: 57 (02/16/23); Goal status: In-progress  3.  Patient will demonstrate 25% improvement in lumbar AROM to improve her ability to complete activities such as work with less difficulty.  Baseline: limited and painful - see above (02/16/23); Goal status: In-progress  4.  Patient will demonstrate ability to rotation cervical spine equal or greater than 60 degrees to improve ability to check blind spot while driving. Baseline: grossly limited per screen (02/16/23); Goal status: In-progress  5.  Patient will complete community, work and/or recreational activities with 50% less limitation due to current condition.  Baseline: difficulty with usual activities such as grocery shopping, and standing a long time, walking a long time, running,  playing with grandchildren, lifting heavy items, moving furniture, walking more than a mile, prolonged sitting, driving, turning her head (02/16/23); Goal status: In-progress  6.  Patient will report highest level of pain with functional activities equal or less than 3/10 to improve her ability to engage in functional activities such as work and leisure.  Baseline: up to 9/10 (02/16/2023);  Goal status: In-progress  PLAN:  PT FREQUENCY: 1-2x/week  PT DURATION: 12 weeks  PLANNED INTERVENTIONS: Therapeutic exercises, Therapeutic activity, Neuromuscular re-education, Balance training, Patient/Family education, Self Care, Joint mobilization, Stair training, DME instructions, Dry Needling, Electrical stimulation, Spinal mobilization, Cryotherapy, Moist heat, Manual therapy, and Re-evaluation.  PLAN FOR NEXT SESSION: update HEP as appropriate, progressive core/LE/functional strengthening and ROM exercises. Neurodynamic techniques as appropriate, education, manual therapy as needed. Further exam and treatment of neck pain as appropriate.    Cira Rue, PT, DPT 02/25/2023, 5:51 PM    [facility] [address] [place], [place] [address] P: [phone] I F: [phone]

## 2023-03-03 ENCOUNTER — Ambulatory Visit: Payer: Medicaid Other | Admitting: Physical Therapy

## 2023-03-08 ENCOUNTER — Encounter: Payer: Medicaid Other | Admitting: Physical Therapy

## 2023-03-09 ENCOUNTER — Ambulatory Visit: Payer: Medicaid Other | Admitting: Physical Therapy

## 2023-03-11 ENCOUNTER — Encounter: Payer: Medicaid Other | Admitting: Physical Therapy

## 2023-03-16 ENCOUNTER — Telehealth: Payer: Self-pay | Admitting: Physical Therapy

## 2023-03-16 ENCOUNTER — Ambulatory Visit: Payer: Medicaid Other | Admitting: Physical Therapy

## 2023-03-16 NOTE — Telephone Encounter (Signed)
Called patient when she did not show up for PT appointment scheduled at 1pm today. Left VM notifying patient of missed visit and upcoming visit on Thursday 03/18/2023 at 1pm.   Huntley Dec R. Ilsa Iha, PT, DPT 03/16/23, 2:16 PM  [facility] [address] [place], [place] [address] P: [phone] I F: [phone]

## 2023-03-18 ENCOUNTER — Ambulatory Visit: Payer: Medicaid Other | Admitting: Physical Therapy

## 2023-03-23 ENCOUNTER — Encounter: Payer: Medicaid Other | Admitting: Physical Therapy

## 2023-03-23 NOTE — Progress Notes (Signed)
 Rheumatology Follow Up Note  Chief Complaint  Patient presents with  . Psoriatic Arthritis      Subjective:HPI  Linda Bennett is a 57 y.o. female is here today for follow up of psoriasis, psoriatic arthritis. The patient's allergies, current medications, past family history, past medical history, past social history, past surgical history and problem list were reviewed and updated as appropriate.   She is taking [employer]/film/video. She states that Rosalio is working well for her. The psoriasis is clear. She has noticed right hand swelling. She was moving things yesterday. She is taking ibuprofen  for pain control. She has no inflammation of the eyes.   Review of Systems:   Review of Systems  Constitutional:  Negative for fatigue.  HENT:  Positive for mouth sores. Negative for trouble swallowing.        Neg: Dry Mouth  Eyes:  Negative for pain and redness.       Dry Eyes  Respiratory:  Negative for cough and shortness of breath.   Cardiovascular:  Negative for chest pain and leg swelling.  Gastrointestinal:  Positive for constipation. Negative for diarrhea and nausea.  Endocrine: Negative for cold intolerance and heat intolerance.  Genitourinary:  Negative for hematuria.  Musculoskeletal:        Per HPI  Skin:  Negative for color change and rash.  Neurological:  Positive for weakness and headaches. Negative for dizziness and numbness.  Hematological:  Does not bruise/bleed easily.  Psychiatric/Behavioral:  Positive for sleep disturbance. Negative for dysphoric mood. The patient is nervous/anxious.   All other systems reviewed and are negative.  Objective:  Vitals:   03/23/23 1052  BP: 132/78  Temp: 36.5 C (97.7 F)  TempSrc: Temporal  Weight: 99.8 kg (220 lb)  Height: 182.2 cm (5' 11.75)  PainSc:   7  PainLoc: Hand    Length of Stiffness: 30 minutes   GEN - Pleasant, No Apparent Distress  HEENT - normocephalic and atraumatic. Conjunctiva Clear.  Neck - supple with no adenopathy or  thyromegaly.   C spine with full range of motion. Heart - regular rate and rhythm, No murmurs/gallops/rub, Nml S1S2 Lungs - clear to auscultation in all fields. Extremities - there is no cyanosis or edema. Neurological - alert and oriented.  Spine - no paraspinal tenderness; no L spine or SI Joint Tenderness Skin - Psoriasis of the umbilicus, elbows overall better  MSK - The following joints were examined bilaterally: Hands, Wrists, Elbows, Shoulders, Metatarsals, Ankels, Knees and Hips; they were normal apart from what is noted.    100% Fist Formation Right 2-4 PIP and MCP S1T1  No Dactylitis No Achilles Pain or Swelling No Tender Point Gait Fluent   ______________________________________________________________________ Labs/Imaging Reviewed in EMR Cr 0.9, AST 32, ALT 34 CBC Hgb 12.2, Hct 40.5 Vitamin D 22.0 (L) Pos: ANA 1:160 TB Skin Test: Negative (11/2021) Neg: Hep B and C (05/2020)   SI Joint Xray (10/2019): Mild sclerosis along the inferior aspect of the right sacroiliac joint without evidence of osseous erosions or narrowing which may be seen in sacroiliitis.    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 X rays at that time showed possible early sacroiliitis.  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  Treatment hx: - Methotrexate was considered  initially for pt given active peripheral disease, but her LFTs returned elevated at her 04/2016 appt, so she was asked to stop mtx.  - Humira 40 mg q 14 days started 04/2016, increased to weekly dosing in 03/2017.  - Recommended change to cosentyx in 11/2017 due to worsening skin psoriasis, started this in April 2019. - Developed ulcerated skin lesions around gluteal cleft in  04/2018, now followed in dermatology for this. ?HS vs PG vs cutaneous vs crohn's.  - Cosentyx changed to renflexis 5mg /kg q 8 wks in 10/2019 due to persistent joint pain.  - Renflexis increased to 7mg /kg in 02/2020. 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 - Stelara started 09/2020 - 08/2021, stopped as it stopped working   -- [employer] Tr[employer]  -- Course of prednisone  to help control right hand synovitis and pains    2. Long term use of immunosuppressive therapy -- Rosalio is an immunosuppressive medication that requires monitoring  -- Check Labs      Diagnoses and all orders for this visit:  Psoriatic arthritis (CMS/HHS-HCC) -     Alanine Aminotransferase (ALT) -     Albumin -     Aspartate Aminotransferase (AST) -     CBC w/auto Differential (5 Part) -     Creatinine -     Sedimentation Rate-Automated -     C-Reactive Protein, Quant - [facility]  Plaque psoriasis  Long-term use of immunosuppressant medication -     Alanine Aminotransferase (ALT) -     Albumin -     Aspartate Aminotransferase (AST) -     CBC w/auto Differential (5 Part) -     Creatinine  Other orders -     TALTZ AUTOINJECTOR 80 mg/mL AtIn; Inject 80 mg subcutaneously monthly -     mometasone (ELOCON) 0.1 % ointment; Apply topically once daily -     predniSONE  (DELTASONE ) 10 MG tablet; Take 4 tablets (40 mg total) by mouth once daily for 3 days, THEN 3 tablets (30 mg total) once daily for 3 days, THEN 2 tablets (20 mg total) once daily for 3 days, THEN 1 tablet (10 mg total) once daily for 3 days.      Return in about 4 months (around 07/23/2023) for Routine Follow Up.   All new prescription medications, changes in current prescription dosages, and sample medications were discussed with the patient, including patient education, medication name, use, dosage, potential side  effects, drug interactions, consequences of not using/taking, and special instructions.  Patient expressed understanding.  No barriers to adherence.   I appreciate the opportunity to participate in the care of Linda Bennett. Please do not hesitate to contact me with any questions or concerns that may arise in regards to the patient's rheumatologic disease.    Attestation Statement:   I personally performed the service. (TP)  MAYUR LOREE BLANCH, MD

## 2023-03-25 ENCOUNTER — Encounter: Payer: Medicaid Other | Admitting: Physical Therapy

## 2023-03-30 ENCOUNTER — Other Ambulatory Visit: Payer: Self-pay | Admitting: Family Medicine

## 2023-03-30 ENCOUNTER — Encounter: Payer: Medicaid Other | Admitting: Physical Therapy

## 2023-03-30 DIAGNOSIS — Z1231 Encounter for screening mammogram for malignant neoplasm of breast: Secondary | ICD-10-CM

## 2023-04-01 ENCOUNTER — Encounter: Payer: Medicaid Other | Admitting: Physical Therapy

## 2023-04-06 ENCOUNTER — Encounter: Payer: Medicaid Other | Admitting: Physical Therapy

## 2023-04-13 ENCOUNTER — Encounter: Payer: Medicaid Other | Admitting: Physical Therapy

## 2023-04-15 ENCOUNTER — Encounter: Payer: Medicaid Other | Admitting: Physical Therapy

## 2023-05-04 ENCOUNTER — Ambulatory Visit
Admission: RE | Admit: 2023-05-04 | Discharge: 2023-05-04 | Disposition: A | Discharge status: Home / Self Care | Payer: Medicaid Other | Source: Ambulatory Visit | Attending: Family Medicine | Admitting: Family Medicine

## 2023-05-04 DIAGNOSIS — Z1231 Encounter for screening mammogram for malignant neoplasm of breast: Secondary | ICD-10-CM | POA: Insufficient documentation

## 2023-12-14 NOTE — Progress Notes (Signed)
 Chief Complaint:   Chief Complaint  Patient presents with  . Annual Exam    CPE    Subjective:   Linda Bennett is a 58 y.o. female in today for recheck and health maintenance issues. Neck pain.  She woke up a week or so ago with some pain in her right neck/ear area.  Since then, pain has moved into the shoulder and down to her right hands. Pain is throbbing at times. Has numbness and tingling as well at times.  Hurts to lift or move her arm or neck in certain ways.  Has tried Ibuprofen  without any pain relief.  No history of trauma or new activities. Hyperlipidemia.  She is on Crestor 40 mg daily.  Lipids checked 10/26/22 showed TC of 227, HDL 47, LDL 161 and TG 97.  No myalgias or GI side effects. Liver enzymes were normal.   DM.  She is on no medications and does not check BS's.  Last A1c was 6.3 on 09/07/23; MA was <5.  No increased thirst or urination.   Anxiety and depression.  Is on Fluoxetine 20 mg daily but admits she may misses doses at times.  She is doing fairly well overall; is not feeling overly down, depressed or anxious.  She has some worry about her daughter, who is battling drug and alcohol addiction. History of GERD.  Is on Protonix  which usually controls heartburn symptoms.   Psoriatic arthritis.  She is followed by Rheumatology.  She is on [employer].  Symptoms are generally controlled.  She uses topical treatments for skin areas and sees Dermatology. Health maintenance issues. Last Pap/pelvic exam Jan 2021 was normal.  Has been menopausal since around age 84.  She has no vaginal discharge or itching. No breast complaints.  Last mammogram 05/04/23 was normal.  No urinary complaints such as dysuria, increased frequency, incontinence.  She is up to date with immunizations except for tetanus.  She had colonoscopy in 08/31/22 that found polyps.  She has a 5-8 pack/year history of smoking, quit in 2019.   Occasional alcohol use.  Family history is positive for HTN and psychiatric  issues, negative for coronary artery disease, stroke, diabetes, colon or breast cancer.  ROS: Per HPI. Has B12 and Vit D deficiencies.  Takes supplements of both vitamins.    Patient Active Problem List  Diagnosis  . Psoriatic arthritis (CMS/HHS-HCC)  . Hyperlipidemia associated with type 2 diabetes mellitus  (CMS/HHS-HCC)  . Type 2 diabetes mellitus with hyperglycemia, without long-term current use of insulin  (CMS/HHS-HCC)  . TIA (transient ischemic attack)  . NAFLD (nonalcoholic fatty liver disease)  . Depression  . Vitamin D deficiency  . Anxiety  . Gastroesophageal reflux disease  . Personal history of colonic polyps  . Plaque psoriasis    Outpatient Medications Prior to Visit  Medication Sig Dispense Refill  . aspirin  325 MG tablet Take 325 mg by mouth once daily    . blood-glucose meter (ACCU-CHEK GUIDE ME GLUCOSE MTR) Misc as directed 1 each 0  . carboxymethylcellulose sodium 0.25 % Administer 1 drop to both eyes Two (2) times a day.    SABRA FLUoxetine (PROZAC) 20 MG capsule Take 1 capsule (20 mg total) by mouth once daily 90 capsule 1  . ibuprofen  (MOTRIN ) 200 MG tablet Take 200 mg by mouth every 8 (eight) hours as needed for Pain    . lancets Use 1 each 3 (three) times daily 100 each 5  . mometasone (ELOCON) 0.1 % ointment Apply topically  once daily 45 g 5  . pantoprazole  (PROTONIX ) 20 MG DR tablet Take 1 tablet (20 mg total) by mouth once daily 90 tablet 1  . SKYRIZI 150 mg/mL PnIj     . blood glucose diagnostic test strip 1 each (1 strip total) 3 (three) times daily 100 each 5  . cholecalciferol (VITAMIN D3) 1000 unit tablet 1,000 Units once daily (Patient not taking: Reported on 12/14/2023)    . doxycycline (VIBRAMYCIN) 100 MG capsule TAKE 1 CAPSULE BY MOUTH ONCE DAILY WITH A FULL MEAL FOR 1-2 MONTHS (Patient not taking: Reported on 12/14/2023)    . RETIN-A 0.025 % cream APPLY A PEA SIZE AMOUNT TO DRY FACE EVERY OTHER NIGHT BUILDING UP TO NIGHTLY (1.25 GRAMS) (Patient not  taking: Reported on 12/14/2023)    . rosuvastatin (CRESTOR) 40 MG tablet Take 1 tablet (40 mg total) by mouth once daily 90 tablet 1   No facility-administered medications prior to visit.   Objective:   Vitals:   12/14/23 1537  BP: 116/72  Pulse: 73  SpO2: 98%  Weight: (!) 101.9 kg (224 lb 9.6 oz)  Height: 168.9 cm (5' 6.5)  PainSc:   7  PainLoc: Neck   Body mass index is 35.71 kg/m.   GENERAL:  Patient is well-developed, well-nourished female, alert, oriented, in no acute distress.  Affect is normal. HEENT:  Normocephalic and atraumatic.  Conjunctivae are non-injected.  Sclera:  Non-icteric. Extraocular movements are intact. Pupils equal, round, and reactive to light.  Tympanic membranes are clear bilaterally.  Oropharynx:  Moist mucous membranes.  No tonsillar enlargement, erythema, exudate, or lesions. NECK:  No adenopathy, thyromegaly, JVD, or carotid bruits. RESPIRATORY:  Normal inspiratory and expiratory effort.  Lungs are clear to auscultation bilaterally.   CARDIOVASCULAR:  Regular rate and rhythm.  S1, S2 without murmur, rub, or gallop. BREASTS:  Deferred.  ABDOMEN:  Nontender, nondistended.  Positive bowel sounds.  No organomegaly. GU:  Vaginal mucosa is mildly atrophic.  Some creamy white discharge in the vaginal vault. Cervical os is multiparous.  No cystocele or rectocele.  No pelvic masses or tenderness. EXTREMITIES:  +2 pulses bilaterally.  No cyanosis, clubbing, or edema. SKIN:  Warm and dry with no rashes or nail changes.  She has various scars from previous surgeries and psoriasis.   NEURO:  Cranial nerves II-XII are intact.  Motor strength is 5/5 with normal tone in the upper and lower extremities bilaterally.  Reflexes are 2+ throughout and symmetric.  Sensation is intact to light touch throughout.    MS:  pain with movement of the right arm and shoulder. Sperling's is positive.  PHQ 2/9 from today's flowsheet  PHQ-2 PHQ-2 Over the last 2 weeks, how often have  you been bothered by any of the following problems? Little interest or pleasure in doing things: Not at all Feeling down, depressed, or hopeless: Not at all Patient Health Questionnaire-2 Score: 0  PHQ-9 (if PHQ >=3)    Depression Severity and Treatment Recommendations:  0-4= None  5-9= Mild / Treatment: Support, educate to call if worse; return in one month  10-14= Moderate / Treatment: Support, watchful waiting; Antidepressant or Psychotherapy  15-19= Moderately severe / Treatment: Antidepressant OR Psychotherapy  >= 20 = Major depression, severe / Antidepressant AND Psychotherapy   Goals     . Exercise (x goals)     Increase exercise and do more    . Maintain health/healthy lifestyle        Assessment/Plan:  58 year old  female here for follow up and health maintenance issues.  Neck pain with radiculopathy.  Will get a C-spine xray.  Start prednisone  taper at 40 mg, titrating down every 3 days by 10  mg. Hyperlipidemia.  Stay on Crestor 40 mg daily.  Recheck lipids and liver. DM.  Work on diet and increased physical activity.  Check A1c and Met B. Depression/anxiety .  Stay on Fluoxetine daily. Psoriatic arthritis.  Stay on current medications.  Follow up with Rheumatology as directed. GERD.  Use Protonix  daily if needed. Vitamin D deficiency.  Stay on Vit D supplement daily.  Check Vit D level. Health maintenance issues.  Sent off Pap smear.  She will set up mammogram for breast cancer screening in next 3-4 months.   Updated TdaP today.  Is up to date with other immunizations but will check on getting a 2nd Shingrix shot.  Does need repeat colonoscopy until 2028.  Encouraged healthy lifestyle, with regular aerobic activity, low fat diet, no smoking and little or no alcohol use.  Follow up in 6 months for recheck, sooner as needed for acute concerns.   Alm Na, MD, PhD

## 2023-12-23 ENCOUNTER — Other Ambulatory Visit
Admission: RE | Admit: 2023-12-23 | Discharge: 2023-12-23 | Disposition: A | Discharge status: Home / Self Care | Source: Ambulatory Visit | Attending: Family Medicine | Admitting: Family Medicine

## 2023-12-23 DIAGNOSIS — L4050 Arthropathic psoriasis, unspecified: Secondary | ICD-10-CM | POA: Diagnosis present

## 2023-12-23 LAB — CBC WITH DIFFERENTIAL/PLATELET
Abs Immature Granulocytes: 0.05 10*3/uL (ref 0.00–0.07)
Basophils Absolute: 0.1 10*3/uL (ref 0.0–0.1)
Basophils Relative: 0 %
Eosinophils Absolute: 0.1 10*3/uL (ref 0.0–0.5)
Eosinophils Relative: 1 %
HCT: 41 % (ref 36.0–46.0)
Hemoglobin: 12.6 g/dL (ref 12.0–15.0)
Immature Granulocytes: 0 %
Lymphocytes Relative: 47 %
Lymphs Abs: 6.3 10*3/uL — ABNORMAL HIGH (ref 0.7–4.0)
MCH: 26.6 pg (ref 26.0–34.0)
MCHC: 30.7 g/dL (ref 30.0–36.0)
MCV: 86.5 fL (ref 80.0–100.0)
Monocytes Absolute: 0.6 10*3/uL (ref 0.1–1.0)
Monocytes Relative: 5 %
Neutro Abs: 6.4 10*3/uL (ref 1.7–7.7)
Neutrophils Relative %: 47 %
Platelets: 262 10*3/uL (ref 150–400)
RBC: 4.74 MIL/uL (ref 3.87–5.11)
RDW: 13.2 % (ref 11.5–15.5)
Smear Review: NORMAL
WBC: 13.5 10*3/uL — ABNORMAL HIGH (ref 4.0–10.5)
nRBC: 0 % (ref 0.0–0.2)

## 2023-12-23 NOTE — Progress Notes (Signed)
 Rheumatology Follow Up Note  Chief Complaint  Patient presents with  . Psoriatic arthritis (CMS/HHS-HCC)      Subjective:HPI  Linda Bennett is a 58 y.o. female is here today for follow up of psoriasis, psoriatic arthritis. The patient's allergies, current medications, past family history, past medical history, past social history, past surgical history and problem list were reviewed and updated as appropriate.   She is taking Skyrizi. The psoriasis is stable currently. She has require topical treatment. She did require course of prednisone  due to neck irritation. She is taking ibuprofen  for pain control. The cortisone injection of the knee has helped. She has no inflammation of the eyes. She has no achilles pain or swelling. She does get lower back pains with stiffness in the morning.   Review of Systems:   Review of Systems  Constitutional:  Negative for fatigue.  HENT:  Negative for mouth sores and trouble swallowing.        Neg: Dry Mouth  Eyes:  Negative for pain and redness.       Dry Eyes  Respiratory:  Negative for cough and shortness of breath.   Cardiovascular:  Negative for chest pain and leg swelling.  Gastrointestinal:  Negative for constipation, diarrhea and nausea.  Endocrine: Negative for cold intolerance and heat intolerance.  Genitourinary:  Negative for hematuria.  Musculoskeletal:        Per HPI  Skin:  Negative for color change and rash.  Neurological:  Negative for dizziness, weakness, numbness and headaches.  Hematological:  Does not bruise/bleed easily.  Psychiatric/Behavioral:  Positive for sleep disturbance. Negative for dysphoric mood. The patient is nervous/anxious.   All other systems reviewed and are negative.  Objective:  Vitals:   12/23/23 0930  BP: 118/70  Temp: (!) 35.9 C (96.6 F)  TempSrc: Temporal  Weight: (!) 101.9 kg (224 lb 10.4 oz)  Height: 168.9 cm (5' 6.5)  PainSc: 0-No pain     Length of Stiffness: 30 minutes   GEN -  Pleasant, No Apparent Distress  HEENT - normocephalic and atraumatic. Conjunctiva Clear.  Neck - supple with no adenopathy or thyromegaly.   C spine with full range of motion. Heart - regular rate and rhythm, No murmurs/gallops/rub, Nml S1S2 Lungs - clear to auscultation in all fields. Extremities - there is no cyanosis or edema. Neurological - alert and oriented.  Spine - no paraspinal tenderness; no L spine or SI Joint Tenderness Skin - Psoriasis of the umbilicus, elbows overall better  MSK - The following joints were examined bilaterally: Hands, Wrists, Elbows, Shoulders, Metatarsals, Ankels, Knees and Hips; they were normal apart from what is noted.    100% Fist Formation Right Knee Crepitus, Pain, no effusion, tenderness of the medial compartment  Left Knee with Crepitus, pain on extension, mild effusion  No Synovitis  No Dactylitis No Achilles Pain or Swelling No Tender Point Gait Fluent   ______________________________________________________________________ Labs/Imaging Reviewed in EMR Cr 0.7, AST 26, ALT 35 CBC Hgb 12.1, Hct 40.5 ESR 35 CRP 2 Pos: ANA 1:160 TB Skin Test: Negative (11/2021) Neg: Hep B and C (05/2020)   SI Joint Xray (10/2019): Mild sclerosis along the inferior aspect of the right sacroiliac joint without evidence of osseous erosions or narrowing which may be seen in sacroiliitis.   Right and Left Knee Xray (10/2023): mildly decreased medial compartment joint space bilaterally, no osteophytes, fractures or dislocations.    Assessment and Plan     Plaque Psoriasis with Psoriatic Arthritis:Much Better  --  Established care with [facility] rheumatology in 04/2016 at which time she had active synovitis and dactylitis suggestive of active psoriatic arthritis, as well as active psoriatic skin disease. X rays at that time showed possible early sacroiliitis.  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  Treatment hx: - Methotrexate was considered  initially for pt given active peripheral disease, but her LFTs returned elevated at her 04/2016 appt, so she was asked to stop mtx.  - Humira 40 mg q 14 days started 04/2016, increased to weekly dosing in 03/2017.  - Recommended change to cosentyx in 11/2017 due to worsening skin psoriasis, started this in April 2019. - Developed ulcerated skin lesions around gluteal cleft in 04/2018, now followed in dermatology for this. ?HS vs PG vs cutaneous vs crohn's.  - Cosentyx changed to renflexis 5mg /kg q 8 wks in 10/2019 due to persistent joint pain.  - Renflexis increased to 7mg /kg in 02/2020. 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 - Stelara started 09/2020 - 08/2021, stopped as it stopped working - Failed Taltz psoriasis flared  -- Continue Skyrizi   2. Knee Osteoarthritis -- Crepitus, pain on extension  -- Continue Motrin  as needed for pains  -- Can use Knee brace -- Ok to use Voltaren  Gel  -- Right Knee Cortisone Injection for pain relief has helped in the past    3. Long term use of immunosuppressive therapy -- Skyrizi is an immunosuppressive medication that requires monitoring  -- Check DEXA Scan  -- Reviewed Labs   Diagnoses and all orders for this visit:  Psoriatic arthritis (CMS/HHS-HCC) -     DXA bone density; Future  Plaque psoriasis  Primary osteoarthritis of both knees  Long-term use of immunosuppressant medication  Long term current use of systemic steroids -     DXA bone density; Future      Return in about 4 months (around 04/23/2024) for Routine Follow Up.   All new prescription medications, changes in current prescription dosages, and sample medications were discussed with the patient, including patient education, medication name, use, dosage, potential side effects, drug interactions, consequences of not using/taking, and special  instructions.  Patient expressed understanding.  No barriers to adherence.   I appreciate the opportunity to participate in the care of Linda Bennett. Please do not hesitate to contact me with any questions or concerns that may arise in regards to the patient's rheumatologic disease.    Attestation Statement:   I personally performed the service. (TP)  MAYUR LOREE BLANCH, MD

## 2024-05-16 NOTE — Progress Notes (Signed)
 Rheumatology Follow Up Note  Chief Complaint  Patient presents with  . Psoriatic Arthritis      Subjective:HPI  Linda Bennett is a 58 y.o. female is here today for follow up of psoriasis, psoriatic arthritis. The patient's allergies, current medications, past family history, past medical history, past social history, past surgical history and problem list were reviewed and updated as appropriate.   She is not taking Skyrizi. She is unsure when she last took it. The psoriasis is active. She has require topical treatment. She has been swelling lately. She is taking ibuprofen  for pain control. The cortisone injection of the knee has helped. She has no inflammation of the eyes. She has no achilles pain or swelling. She does get lower back pains with stiffness in the morning. She gets random pain tingling over her body. This does not last long.   Review of Systems:   Review of Systems  Constitutional:  Negative for fatigue.  HENT:  Negative for mouth sores and trouble swallowing.        Neg: Dry Mouth  Eyes:  Negative for pain and redness.       Dry Eyes  Respiratory:  Negative for cough and shortness of breath.   Cardiovascular:  Negative for chest pain and leg swelling.  Gastrointestinal:  Negative for constipation, diarrhea and nausea.  Endocrine: Negative for cold intolerance and heat intolerance.  Genitourinary:  Negative for hematuria.  Musculoskeletal:        Per HPI  Skin:  Negative for color change and rash.  Neurological:  Negative for dizziness, weakness, numbness and headaches.  Hematological:  Does not bruise/bleed easily.  Psychiatric/Behavioral:  Positive for sleep disturbance. Negative for dysphoric mood. The patient is nervous/anxious.   All other systems reviewed and are negative.  Objective:  Vitals:   05/16/24 1020  BP: 136/80  Temp: 36.1 C (97 F)  TempSrc: Temporal  Weight: (!) 101.9 kg (224 lb 10.4 oz)  Height: 168.9 cm (5' 6.5)  PainSc:   4      Length of Stiffness: 30 minutes   GEN - Pleasant, No Apparent Distress  HEENT - normocephalic and atraumatic. Conjunctiva Clear.  Neck - supple with no adenopathy or thyromegaly.   C spine with full range of motion. Heart - regular rate and rhythm, No murmurs/gallops/rub, Nml S1S2 Lungs - clear to auscultation in all fields. Extremities - there is no cyanosis or edema. Neurological - alert and oriented.  Spine - no paraspinal tenderness; no L spine or SI Joint Tenderness Skin - Psoriasis of the umbilicus, elbows mild active  MSK - The following joints were examined bilaterally: Hands, Wrists, Elbows, Shoulders, Metatarsals, Ankels, Knees and Hips; they were normal apart from what is noted.    100% Fist Formation Right 3rd PIP S1T1 Right Knee Crepitus, Pain, no effusion, tenderness of the medial compartment  Left Knee with Crepitus, pain on extension, mild effusion  No Dactylitis No Achilles Pain or Swelling No Tender Point Gait Fluent   ______________________________________________________________________ Labs/Imaging Reviewed in EMR Cr 0.9, AST 15, ALT 25 CBC Hgb 12.6, Hct 41.0, Plt 262 ESR 35 CRP 2 Pos: ANA 1:160 TB Skin Test: Negative (11/2021) Neg: Hep B and C (05/2020)   SI Joint Xray (10/2019): Mild sclerosis along the inferior aspect of the right sacroiliac joint without evidence of osseous erosions or narrowing which may be seen in sacroiliitis.   Right and Left Knee Xray (10/2023): mildly decreased medial compartment joint space bilaterally, no osteophytes, fractures or dislocations.  Assessment and Plan     Plaque Psoriasis with Psoriatic Arthritis: Active  -- Established care with [facility] rheumatology in 04/2016 at which time she had active synovitis and dactylitis suggestive of active psoriatic arthritis, as well as active psoriatic skin disease. X rays at that time showed possible early sacroiliitis.  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  Treatment hx: - Methotrexate was considered initially for pt given active peripheral disease, but her LFTs returned elevated at her 04/2016 appt, so she was asked to stop mtx.  - Humira 40 mg q 14 days started 04/2016, increased to weekly dosing in 03/2017.  - Recommended change to cosentyx in 11/2017 due to worsening skin psoriasis, started this in April 2019. - Developed ulcerated skin lesions around gluteal cleft in 04/2018, now followed in dermatology for this. ?HS vs PG vs cutaneous vs crohn's.  - Cosentyx changed to renflexis 5mg /kg q 8 wks in 10/2019 due to persistent joint pain.  - Renflexis increased to 7mg /kg in 02/2020. 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 - Stelara started 09/2020 - 08/2021, stopped as it stopped working - Failed Taltz psoriasis flared, Skyrizi -- Start Tremfya; she has had communication trouble with [facility] in the past   2. Knee Osteoarthritis -- Crepitus, pain on extension  -- Continue Motrin  as needed for pains  -- Can use Knee brace -- Ok to use Voltaren  Gel  -- Right Knee Cortisone Injection for pain relief has helped in the past    3. Long term use of immunosuppressive therapy -- Tremfya  is an immunosuppressive medication that requires monitoring  -- Normal DEXA Scan  -- Reviewed Labs   Diagnoses and all orders for this visit:  Psoriatic arthritis (CMS/HHS-HCC) -     Sedimentation Rate-Automated  Plaque psoriasis  Long-term use of immunosuppressant medication  Tuberculosis screening -     QuantiFERON-TB Gold Plus - [facility]       Return in about 4 months (around 09/15/2024) for Routine Follow Up.   All new prescription medications, changes in current prescription dosages, and sample medications were discussed with the patient, including patient education, medication name, use, dosage,  potential side effects, drug interactions, consequences of not using/taking, and special instructions.  Patient expressed understanding.  No barriers to adherence.   I appreciate the opportunity to participate in the care of Linda Helin. Please do not hesitate to contact me with any questions or concerns that may arise in regards to the patient's rheumatologic disease.    Attestation Statement:   I personally performed the service. (TP)  MAYUR LOREE BLANCH, MD

## 2024-07-29 ENCOUNTER — Other Ambulatory Visit: Payer: Self-pay

## 2024-07-29 ENCOUNTER — Emergency Department: Admission: EM | Admit: 2024-07-29 | Discharge: 2024-07-29 | Disposition: A | Discharge status: Home / Self Care

## 2024-07-29 DIAGNOSIS — K644 Residual hemorrhoidal skin tags: Secondary | ICD-10-CM | POA: Insufficient documentation

## 2024-07-29 DIAGNOSIS — K922 Gastrointestinal hemorrhage, unspecified: Secondary | ICD-10-CM | POA: Insufficient documentation

## 2024-07-29 DIAGNOSIS — E119 Type 2 diabetes mellitus without complications: Secondary | ICD-10-CM | POA: Diagnosis not present

## 2024-07-29 DIAGNOSIS — K648 Other hemorrhoids: Secondary | ICD-10-CM | POA: Insufficient documentation

## 2024-07-29 DIAGNOSIS — R944 Abnormal results of kidney function studies: Secondary | ICD-10-CM | POA: Insufficient documentation

## 2024-07-29 DIAGNOSIS — K625 Hemorrhage of anus and rectum: Secondary | ICD-10-CM | POA: Diagnosis present

## 2024-07-29 LAB — TYPE AND SCREEN
ABO/RH(D): O POS
Antibody Screen: NEGATIVE

## 2024-07-29 LAB — COMPREHENSIVE METABOLIC PANEL WITH GFR
ALT: 20 U/L (ref 0–44)
AST: 20 U/L (ref 15–41)
Albumin: 3.8 g/dL (ref 3.5–5.0)
Alkaline Phosphatase: 84 U/L (ref 38–126)
Anion gap: 12 (ref 5–15)
BUN: 15 mg/dL (ref 6–20)
CO2: 26 mmol/L (ref 22–32)
Calcium: 9.2 mg/dL (ref 8.9–10.3)
Chloride: 105 mmol/L (ref 98–111)
Creatinine, Ser: 1.02 mg/dL — ABNORMAL HIGH (ref 0.44–1.00)
GFR, Estimated: >60 mL/min (ref >60)
Glucose, Bld: 114 mg/dL — ABNORMAL HIGH (ref 70–99)
Potassium: 3.8 mmol/L (ref 3.5–5.1)
Sodium: 143 mmol/L (ref 135–145)
Total Bilirubin: 1.1 mg/dL (ref 0.0–1.2)
Total Protein: 7.8 g/dL (ref 6.5–8.1)

## 2024-07-29 LAB — CBC
HCT: 39.3 % (ref 36.0–46.0)
Hemoglobin: 12.3 g/dL (ref 12.0–15.0)
MCH: 26.1 pg (ref 26.0–34.0)
MCHC: 31.3 g/dL (ref 30.0–36.0)
MCV: 83.4 fL (ref 80.0–100.0)
Platelets: 240 K/uL (ref 150–400)
RBC: 4.71 MIL/uL (ref 3.87–5.11)
RDW: 12.5 % (ref 11.5–15.5)
WBC: 10.4 K/uL (ref 4.0–10.5)
nRBC: 0 % (ref 0.0–0.2)

## 2024-07-29 MED ORDER — HYDROCORTISONE ACETATE 25 MG RE SUPP: 25.0000 mg | Freq: Two times a day (BID) | RECTAL | 0 refills | Status: AC

## 2024-07-29 MED ORDER — HYDROCORTISONE ACETATE 25 MG RE SUPP
25.0000 mg | Freq: Once | RECTAL | Status: AC
  Administered 2024-07-29: 25 mg via RECTAL
  Filled 2024-07-29: qty 1

## 2024-07-29 NOTE — Discharge Instructions (Signed)
 You was seen in the emergency department for over 12 hours of bright red blood per rectum.  You were not anemic and your abdominal exam was benign.  I do think this is likely secondary to an internal hemorrhoid but you will need follow-up with your primary care physician and your gastroenterologist.  I have prescribed some suppositories.  If you develop significant lightheadedness, loss of consciousness or shortness of breath, these may be signs of acute anemia and please return with any acutely worsening symptoms.  It was very nice meeting you and I wish you the best of luck. -- RETURN PRECAUTIONS & AFTERCARE: (ENGLISH) RETURN PRECAUTIONS: Return immediately to the emergency department or see/call your doctor if you feel worse, weak or have changes in speech or vision, are short of breath, have fever, vomiting, pain, bleeding or dark stool, trouble urinating or any new issues. Return here or see/call your doctor if not improving as expected for your suspected condition. FOLLOW-UP CARE: Call your doctor and/or any doctors we referred you to for more advice and to make an appointment. Do this today, tomorrow or after the weekend. Some doctors only take PPO insurance so if you have HMO insurance you may want to contact your HMO or your regular doctor for referral to a specialist within your plan. Either way tell the doctor's office that it was a referral from the emergency department so you get the soonest possible appointment.  YOUR TEST RESULTS: Take result reports of any blood or urine tests, imaging tests and EKG's to your doctor and any referral doctor. Have any abnormal tests repeated. Your doctor or a referral doctor can let you know when this should be done. Also make sure your doctor contacts this hospital to get any test results that are not currently available such as cultures or special tests for infection and final imaging reports, which are often not available at the time you leave the ER but which  may list additional important findings that are not documented on the preliminary report. BLOOD PRESSURE: If your blood pressure was greater than 120/80 have your blood pressure rechecked within 1 to 2 weeks. MEDICATION SIDE EFFECTS: Do not drive, walk, bike, take the bus, etc. if you have received or are being prescribed any sedating medications such as those for pain or anxiety or certain antihistamines like Benadryl . If you have been give one of these here get a taxi home or have a friend drive you home. Ask your pharmacist to counsel you on potential side effects of any new medication

## 2024-07-29 NOTE — ED Provider Notes (Signed)
 [facility] Provider Note    Event Date/Time   First MD Initiated Contact with Patient 07/29/24 1719     (approximate)   History   Rectal Bleeding   HPI  Linda Bennett is a 58 y.o. female psoriasis, Type 2 DM, CVA, and history of both internal and external hemorrhoids who presents to the emergency department with over 24 hours of bright red blood per rectum.  Patient reports that yesterday evening she developed some very mild abdominal pain which has resolved and diarrhea.  She subsequently had several episodes of bright red blood per rectum.  She is not on blood thinners.  She has had similar symptoms previously.  She does have a gastroenterologist with her last colonoscopy completed on 08/31/2022 [place]) where she had internal hemorrhoids.  She currently denies any abdominal pain any lightheadedness and had no syncopal episodes.  She presents with her sister who helps contribute to the history.     Physical Exam   Triage Vital Signs: ED Triage Vitals  Encounter Vitals Group     BP 07/29/24 1430 (!) 117/53     Girls Systolic BP Percentile --      Girls Diastolic BP Percentile --      Boys Systolic BP Percentile --      Boys Diastolic BP Percentile --      Pulse Rate 07/29/24 1430 61     Resp 07/29/24 1430 20     Temp 07/29/24 1430 97.9 F (36.6 C)     Temp Source 07/29/24 1430 Oral     SpO2 07/29/24 1430 100 %     Weight 07/29/24 1432 240 lb (108.9 kg)     Height 07/29/24 1432 5' 6 (1.676 m)     Head Circumference --      Peak Flow --      Pain Score 07/29/24 1431 6     Pain Loc --      Pain Education --      Exclude from Growth Chart --     Most recent vital signs: Vitals:   07/29/24 1430 07/29/24 1905  BP: (!) 117/53 (!) 146/86  Pulse: 61 63  Resp: 20 16  Temp: 97.9 F (36.6 C)   SpO2: 100% 98%    Nursing Triage Note reviewed. Vital signs reviewed and patients oxygen saturation is normoxic  General: Patient is well nourished,  well developed, awake and alert, resting comfortably in no acute distress Head: Normocephalic and atraumatic Eyes: Normal inspection, extraocular muscles intact, no conjunctival pallor Ear, nose, throat: Normal external exam Neck: Normal range of motion Respiratory: Patient is in no respiratory distress, lungs CTAB Cardiovascular: Patient is not tachycardic, RRR without murmur appreciated GI: Abd SNT with no guarding or rebound  Back: Normal inspection of the back with good strength and range of motion throughout all ext Extremities: pulses intact with good cap refills, no LE pitting edema or calf tenderness Neuro: The patient is alert and oriented to person, place, and time, appropriately conversive, with 5/5 bilat UE/LE strength, no gross motor or sensory defects noted. Coordination appears to be adequate. Skin: Warm, dry, and intact Psych: normal mood and affect, no SI or HI  Rectal exam with APP Garrel as chaperone at bedside, patient does have a nonthrombosed external hemorrhoids and on rectal exam there is very minimal gross blood and I do palpate a small mass consistent with internal hemorrhoid  ED Results / Procedures / Treatments   Labs (all labs ordered are  listed, but only abnormal results are displayed) Labs Reviewed  COMPREHENSIVE METABOLIC PANEL WITH GFR - Abnormal; Notable for the following components:      Result Value   Glucose, Bld 114 (*)    Creatinine, Ser 1.02 (*)    All other components within normal limits  CBC  POC OCCULT BLOOD, ED  TYPE AND SCREEN     EKG None  RADIOLOGY None    PROCEDURES:  Critical Care performed: No  Procedures   MEDICATIONS ORDERED IN ED: Medications  hydrocortisone (ANUSOL-HC) suppository 25 mg (25 mg Rectal Given 07/29/24 1849)     IMPRESSION / MDM / ASSESSMENT AND PLAN / ED COURSE                                Differential diagnosis includes, but is not limited to, lower GI bleed, acute anemia, electrolyte  derangement, malignancy,   ED course: Patient is very well-appearing and well-perfused.  She is not tachycardic or hypotensive on arrival.  Blood work demonstrated no anemia and no profound electrolyte derangements, despite over 12 hours of symptoms.  She has no elevated BUN/creatinine ratio. on abdominal exam she had no palpitation at all.  She has a previous documented history of internal hemorrhoids and has a gastroenterologist that she can follow-up expeditiously with.  I did consider ordering a CT abdomen pelvis but given patient's benign abdominal exam I do not think this is indicated at this time.  I did consider observing the patient and obtaining repeat hematocrits however her hematocrit is better than baseline despite her symptoms.  Given her past history and we sent colonoscopy, will initiate hydrocortisone/Anusol suppositories and she will call her gastroenterologist and primary care physician on Monday.  I reviewed return precautions and she voiced understanding and will have a low threshold to return with any acutely worsening symptoms   Clinical Course as of 07/29/24 2341  Sat Jul 29, 2024  1738 CBC Not acutely anemic [HD]  2339 Creatinine(!): 1.02 [HD]    Clinical Course User Index [HD] Nicholaus Rolland BRAVO, MD   At time of discharge there is no evidence of acute life, limb, vision, or fertility threat. Patient has stable vital signs, pain is well controlled, patient is ambulatory and p.o. tolerant.  Discharge instructions were completed using the EPIC system. I would refer you to those at this time. All warnings prescriptions follow-up etc. were discussed in detail with the patient. Patient indicates understanding and is agreeable with this plan. All questions answered.  Patient is made aware that they may return to the emergency department for any worsening or new condition or for any other emergency.   -- Risk: 5 This patient has a high risk of morbidity due to further  diagnostic testing or treatment. Rationale: This patient's evaluation and management involve a high risk of morbidity due to the potential severity of presenting symptoms, need for diagnostic testing, and/or initiation of treatment that may require close monitoring. The differential includes conditions with potential for significant deterioration or requiring escalation of care. Treatment decisions in the ED, including medication administration, procedural interventions, or disposition planning, reflect this level of risk. COPA: 5 The patient has the following acute or chronic illness/injury that poses a possible threat to life or bodily function: [X] : The patient has a potentially serious acute condition or an acute exacerbation of a chronic illness requiring urgent evaluation and management in the Emergency Department. The clinical presentation  necessitates immediate consideration of life-threatening or function-threatening diagnoses, even if they are ultimately ruled out.   FINAL CLINICAL IMPRESSION(S) / ED DIAGNOSES   Final diagnoses:  Lower GI bleed     Rx / DC Orders   ED Discharge Orders          Ordered    hydrocortisone (ANUSOL-HC) 25 MG suppository  Every 12 hours        07/29/24 1900             Note:  This document was prepared using Dragon voice recognition software and may include unintentional dictation errors.   Nicholaus Rolland BRAVO, MD 07/29/24 [phone]

## 2024-07-29 NOTE — ED Triage Notes (Signed)
 Pt to ED for bright red rectal bleeding with dime size clots since 7pm last night. No thinners. Denies abdominal pain and diarrhea. Denies dizziness weakness but states has felt tired for last 1 week. Having rectal pain also. Hx of hemorrhoids.

## 2024-08-02 ENCOUNTER — Other Ambulatory Visit: Payer: Self-pay | Admitting: Gastroenterology

## 2024-08-02 ENCOUNTER — Ambulatory Visit
Admission: RE | Admit: 2024-08-02 | Discharge: 2024-08-02 | Disposition: A | Discharge status: Home / Self Care | Source: Ambulatory Visit | Attending: Gastroenterology | Admitting: Gastroenterology

## 2024-08-02 DIAGNOSIS — R109 Unspecified abdominal pain: Secondary | ICD-10-CM | POA: Insufficient documentation

## 2024-08-02 DIAGNOSIS — R110 Nausea: Secondary | ICD-10-CM

## 2024-08-02 DIAGNOSIS — R1032 Left lower quadrant pain: Secondary | ICD-10-CM | POA: Diagnosis present

## 2024-08-02 DIAGNOSIS — K625 Hemorrhage of anus and rectum: Secondary | ICD-10-CM | POA: Diagnosis present

## 2024-08-02 MED ORDER — IOHEXOL 300 MG/ML  SOLN
100.0000 mL | Freq: Once | INTRAMUSCULAR | Status: AC | PRN
  Administered 2024-08-02: 100 mL via INTRAVENOUS
